# Patient Record
Sex: Female | Born: 1975 | Race: Black or African American | Hispanic: No | Marital: Single | Smoking: Former smoker
Health system: Southern US, Community
[De-identification: ages and names within clinical notes are randomized; demographics above are authoritative.]

## PROBLEM LIST (undated history)

## (undated) DIAGNOSIS — I10 Essential (primary) hypertension: Secondary | ICD-10-CM

## (undated) DIAGNOSIS — N189 Chronic kidney disease, unspecified: Secondary | ICD-10-CM

## (undated) HISTORY — PX: OTHER SURGICAL HISTORY: SHX169

## (undated) HISTORY — PX: COLONOSCOPY: SHX174

## (undated) HISTORY — PX: TONSILLECTOMY: SUR1361

---

## 2004-09-03 ENCOUNTER — Emergency Department: Payer: Self-pay | Admitting: Emergency Medicine

## 2004-11-29 ENCOUNTER — Ambulatory Visit: Payer: Self-pay | Admitting: Family Medicine

## 2004-12-13 ENCOUNTER — Emergency Department: Payer: Self-pay | Admitting: General Practice

## 2004-12-15 ENCOUNTER — Emergency Department: Payer: Self-pay | Admitting: Emergency Medicine

## 2005-02-10 ENCOUNTER — Ambulatory Visit: Payer: Self-pay | Admitting: Unknown Physician Specialty

## 2005-05-28 ENCOUNTER — Emergency Department: Payer: Self-pay | Admitting: Emergency Medicine

## 2005-11-11 ENCOUNTER — Emergency Department: Payer: Self-pay | Admitting: Emergency Medicine

## 2005-11-12 ENCOUNTER — Emergency Department: Payer: Self-pay | Admitting: Emergency Medicine

## 2006-01-28 ENCOUNTER — Emergency Department: Payer: Self-pay | Admitting: General Practice

## 2006-09-08 ENCOUNTER — Emergency Department: Payer: Self-pay | Admitting: Internal Medicine

## 2006-12-30 ENCOUNTER — Emergency Department: Payer: Self-pay | Admitting: Emergency Medicine

## 2007-06-16 ENCOUNTER — Emergency Department: Payer: Self-pay | Admitting: Unknown Physician Specialty

## 2008-01-02 ENCOUNTER — Emergency Department: Payer: Self-pay | Admitting: Emergency Medicine

## 2008-06-15 ENCOUNTER — Emergency Department: Payer: Self-pay | Admitting: Emergency Medicine

## 2008-06-18 ENCOUNTER — Emergency Department: Payer: Self-pay | Admitting: Emergency Medicine

## 2008-09-30 ENCOUNTER — Emergency Department: Payer: Self-pay | Admitting: Emergency Medicine

## 2009-01-11 ENCOUNTER — Emergency Department: Payer: Self-pay | Admitting: Emergency Medicine

## 2009-09-17 ENCOUNTER — Emergency Department: Payer: Self-pay | Admitting: Internal Medicine

## 2012-10-27 ENCOUNTER — Emergency Department: Payer: Self-pay | Admitting: Emergency Medicine

## 2013-07-06 ENCOUNTER — Emergency Department: Payer: Self-pay | Admitting: Emergency Medicine

## 2013-07-06 LAB — BASIC METABOLIC PANEL
Anion Gap: 4 — ABNORMAL LOW (ref 7–16)
BUN: 14 mg/dL (ref 7–18)
Calcium, Total: 8.7 mg/dL (ref 8.5–10.1)
Co2: 26 mmol/L (ref 21–32)
Creatinine: 1.21 mg/dL (ref 0.60–1.30)
EGFR (African American): >60
EGFR (Non-African Amer.): 58 — ABNORMAL LOW
Glucose: 84 mg/dL (ref 65–99)
Potassium: 4.2 mmol/L (ref 3.5–5.1)
Sodium: 140 mmol/L (ref 136–145)

## 2013-07-06 LAB — CBC
HCT: 38.7 % (ref 35.0–47.0)
HGB: 13.4 g/dL (ref 12.0–16.0)
MCHC: 34.7 g/dL (ref 32.0–36.0)
MCV: 93 fL (ref 80–100)
RDW: 13.5 % (ref 11.5–14.5)

## 2013-07-06 LAB — TROPONIN I: Troponin-I: <0.02 ng/mL

## 2015-02-14 ENCOUNTER — Emergency Department
Admit: 2015-02-14 | Disposition: A | Discharge status: Home / Self Care | Payer: Self-pay | Admitting: Physician Assistant

## 2015-02-14 LAB — COMPREHENSIVE METABOLIC PANEL
ALT: 30 U/L
Albumin: 3.7 g/dL
Alkaline Phosphatase: 47 U/L
Anion Gap: 12 (ref 7–16)
BILIRUBIN TOTAL: 0.5 mg/dL
BUN: 26 mg/dL — ABNORMAL HIGH
CHLORIDE: 102 mmol/L
CO2: 24 mmol/L
Calcium, Total: 9.9 mg/dL
Creatinine: 3.01 mg/dL — ABNORMAL HIGH
GFR CALC AF AMER: 22 — AB
GFR CALC NON AF AMER: 19 — AB
Glucose: 118 mg/dL — ABNORMAL HIGH
POTASSIUM: 3.7 mmol/L
SGOT(AST): 33 U/L
Sodium: 138 mmol/L
Total Protein: 7.6 g/dL

## 2015-02-14 LAB — CBC WITH DIFFERENTIAL/PLATELET
BASOS ABS: 0 10*3/uL (ref 0.0–0.1)
Basophil %: 0.4 %
EOS ABS: 0.1 10*3/uL (ref 0.0–0.7)
Eosinophil %: 1.7 %
HCT: 39.7 % (ref 35.0–47.0)
HGB: 13.4 g/dL (ref 12.0–16.0)
LYMPHS ABS: 1.7 10*3/uL (ref 1.0–3.6)
Lymphocyte %: 24.1 %
MCH: 31.2 pg (ref 26.0–34.0)
MCHC: 33.7 g/dL (ref 32.0–36.0)
MCV: 93 fL (ref 80–100)
MONOS PCT: 11.9 %
Monocyte #: 0.9 x10 3/mm (ref 0.2–0.9)
Neutrophil #: 4.5 10*3/uL (ref 1.4–6.5)
Neutrophil %: 61.9 %
PLATELETS: 255 10*3/uL (ref 150–440)
RBC: 4.28 10*6/uL (ref 3.80–5.20)
RDW: 12.8 % (ref 11.5–14.5)
WBC: 7.3 10*3/uL (ref 3.6–11.0)

## 2015-02-14 LAB — URINALYSIS, COMPLETE
Bilirubin,UR: NEGATIVE
GLUCOSE, UR: NEGATIVE mg/dL (ref 0–75)
Ketone: NEGATIVE
LEUKOCYTE ESTERASE: NEGATIVE
NITRITE: NEGATIVE
Ph: 5 (ref 4.5–8.0)
Protein: >=500
RBC,UR: 1 /HPF (ref 0–5)
SPECIFIC GRAVITY: 1.022 (ref 1.003–1.030)
Squamous Epithelial: 6
WBC UR: 11 /HPF (ref 0–5)

## 2015-02-15 ENCOUNTER — Emergency Department
Admit: 2015-02-15 | Disposition: A | Discharge status: Home / Self Care | Payer: Self-pay | Admitting: Emergency Medicine

## 2015-02-15 LAB — CREATININE, SERUM
Creatinine: 2.6 mg/dL — ABNORMAL HIGH
GFR CALC AF AMER: 26 — AB
GFR CALC NON AF AMER: 23 — AB

## 2015-11-06 ENCOUNTER — Emergency Department
Admission: EM | Admit: 2015-11-06 | Discharge: 2015-11-06 | Disposition: A | Discharge status: Home / Self Care | Payer: Medicaid Other | Attending: Emergency Medicine | Admitting: Emergency Medicine

## 2015-11-06 ENCOUNTER — Encounter: Payer: Self-pay | Admitting: Emergency Medicine

## 2015-11-06 ENCOUNTER — Emergency Department: Payer: Medicaid Other

## 2015-11-06 DIAGNOSIS — R55 Syncope and collapse: Secondary | ICD-10-CM | POA: Insufficient documentation

## 2015-11-06 DIAGNOSIS — I129 Hypertensive chronic kidney disease with stage 1 through stage 4 chronic kidney disease, or unspecified chronic kidney disease: Secondary | ICD-10-CM | POA: Diagnosis not present

## 2015-11-06 DIAGNOSIS — N189 Chronic kidney disease, unspecified: Secondary | ICD-10-CM | POA: Diagnosis not present

## 2015-11-06 DIAGNOSIS — M542 Cervicalgia: Secondary | ICD-10-CM | POA: Diagnosis not present

## 2015-11-06 DIAGNOSIS — Z3202 Encounter for pregnancy test, result negative: Secondary | ICD-10-CM | POA: Insufficient documentation

## 2015-11-06 DIAGNOSIS — Z87891 Personal history of nicotine dependence: Secondary | ICD-10-CM | POA: Insufficient documentation

## 2015-11-06 DIAGNOSIS — R51 Headache: Secondary | ICD-10-CM | POA: Insufficient documentation

## 2015-11-06 HISTORY — DX: Essential (primary) hypertension: I10

## 2015-11-06 LAB — BASIC METABOLIC PANEL
ANION GAP: 5 (ref 5–15)
BUN: 29 mg/dL — ABNORMAL HIGH (ref 6–20)
CALCIUM: 9.2 mg/dL (ref 8.9–10.3)
CO2: 23 mmol/L (ref 22–32)
Chloride: 108 mmol/L (ref 101–111)
Creatinine, Ser: 3.26 mg/dL — ABNORMAL HIGH (ref 0.44–1.00)
GFR, EST AFRICAN AMERICAN: 20 mL/min — AB (ref >60)
GFR, EST NON AFRICAN AMERICAN: 17 mL/min — AB (ref >60)
Glucose, Bld: 106 mg/dL — ABNORMAL HIGH (ref 65–99)
Potassium: 4.1 mmol/L (ref 3.5–5.1)
SODIUM: 136 mmol/L (ref 135–145)

## 2015-11-06 LAB — URINALYSIS COMPLETE WITH MICROSCOPIC (ARMC ONLY)
BILIRUBIN URINE: NEGATIVE
GLUCOSE, UA: NEGATIVE mg/dL
HGB URINE DIPSTICK: NEGATIVE
KETONES UR: NEGATIVE mg/dL
LEUKOCYTES UA: NEGATIVE
NITRITE: NEGATIVE
PH: 5 (ref 5.0–8.0)
Protein, ur: >500 mg/dL — AB
Specific Gravity, Urine: 1.005 (ref 1.005–1.030)

## 2015-11-06 LAB — CBC
HCT: 36.4 % (ref 35.0–47.0)
HEMOGLOBIN: 12.5 g/dL (ref 12.0–16.0)
MCH: 31.5 pg (ref 26.0–34.0)
MCHC: 34.2 g/dL (ref 32.0–36.0)
MCV: 92.2 fL (ref 80.0–100.0)
PLATELETS: 212 10*3/uL (ref 150–440)
RBC: 3.95 MIL/uL (ref 3.80–5.20)
RDW: 13.6 % (ref 11.5–14.5)
WBC: 6.9 10*3/uL (ref 3.6–11.0)

## 2015-11-06 LAB — POCT PREGNANCY, URINE: Preg Test, Ur: NEGATIVE

## 2015-11-06 MED ORDER — SODIUM CHLORIDE 0.9 % IV SOLN
Freq: Once | INTRAVENOUS | Status: AC
  Administered 2015-11-06: 20:00:00 via INTRAVENOUS

## 2015-11-06 NOTE — ED Notes (Signed)
While mopping floor with clorox, started to feel lightheaded, walked out of room to take a break and while walking back to bedroom, passed out.  Occurred approximately 45 min PTA.  C/O neck and back pain.    AAOx3.  Moving all extremities equally and strong.  Gait steady.  Posture upright.

## 2015-11-06 NOTE — ED Notes (Signed)
Pt presents to ED with c/o "I passed out at home." Pt reports that she uses bleach to clean and it often makes her feel lightheaded. Pt reports that she woke up after passing out with her daughter standing over her. Pt reports pain to left side of neck with movement. Pt denies chest pain/shortness of breath. Pt is awake and alert, speaking in complete, coherent sentences without difficulty at this time. No acute distress noted. Will continue to monitor.

## 2015-11-06 NOTE — ED Notes (Signed)
Pt resting quietly on stretcher with NS infusing at this time. Requested urine sample from pt... Pt reports unable to urinate at this time. Pt instructed to notify staff when able to urinate. Call bell within reach. No acute distress noted at this time. Will continue to monitor.

## 2015-11-06 NOTE — ED Provider Notes (Signed)
[facility] Emergency Department Provider Note     Time seen: ----------------------------------------- 7:36 PM on 11/06/2015 -----------------------------------------    I have reviewed the triage vital signs and the nursing notes.   HISTORY  Chief Complaint Loss of Consciousness    HPI Maria Chen is a 39 y.o. female who presents to ER after a syncopal event. Patient was mopping the floor with Clorox, she walked other undetected breakable walking back to the bathroom she passed out. This occurred approximately 45 minutes prior to arrival. She does complain of a headache with some mild neck and back pain. She denies any recent illness or changes in her medicines.   Past Medical History  Diagnosis Date  . Hypertension     There are no active problems to display for this patient.   History reviewed. No pertinent past surgical history.  Allergies Altace  Social History Social History  Substance Use Topics  . Smoking status: Former Research scientist (life sciences)  . Smokeless tobacco: Never Used  . Alcohol Use: No    Review of Systems Constitutional: Negative for fever. Eyes: Negative for visual changes. ENT: Negative for sore throat. Cardiovascular: Negative for chest pain. Respiratory: Negative for shortness of breath. Gastrointestinal: Negative for abdominal pain, vomiting and diarrhea. Genitourinary: Negative for dysuria. Musculoskeletal: Negative for back pain. Skin: Negative for rash. Neurological: Positive for headache  10-point ROS otherwise negative.  ____________________________________________   PHYSICAL EXAM:  VITAL SIGNS: ED Triage Vitals  Enc Vitals Group     BP 11/06/15 1819 141/89 mmHg     Pulse Rate 11/06/15 1819 67     Resp 11/06/15 1819 16     Temp 11/06/15 1819 97.8 F (36.6 C)     Temp Source 11/06/15 1819 Oral     SpO2 11/06/15 1819 93 %     Weight 11/06/15 1819 235 lb (106.595 kg)     Height 11/06/15 1819 5\' 5"   (1.651 m)     Head Cir --      Peak Flow --      Pain Score 11/06/15 1821 8     Pain Loc --      Pain Edu? --      Excl. in [place]? --     Constitutional: Alert and oriented. Well appearing and in no distress. Eyes: Conjunctivae are normal. PERRL. Normal extraocular movements. ENT   Head: Midline parietal scalp tenderness   Nose: No congestion/rhinnorhea.   Mouth/Throat: Mucous membranes are moist.   Neck: No stridor. Cardiovascular: Normal rate, regular rhythm. Normal and symmetric distal pulses are present in all extremities. No murmurs, rubs, or gallops. Respiratory: Normal respiratory effort without tachypnea nor retractions. Breath sounds are clear and equal bilaterally. No wheezes/rales/rhonchi. Gastrointestinal: Soft and nontender. No distention. No abdominal bruits.  Musculoskeletal: Nontender with normal range of motion in all extremities. No joint effusions.  No lower extremity tenderness nor edema. Neurologic:  Normal speech and language. No gross focal neurologic deficits are appreciated. Speech is normal. No gait instability. Skin:  Skin is warm, dry and intact. No rash noted. Psychiatric: Mood and affect are normal. Speech and behavior are normal. Patient exhibits appropriate insight and judgment. ____________________________________________  EKG: Interpreted by me. Normal sinus rhythm with a rate of 60 bpm, normal PR interval, normal QRS with, normal QT interval.  ____________________________________________  ED COURSE:  Pertinent labs & imaging results that were available during my care of the patient were reviewed by me and considered in my medical decision making (see chart for  details). Patient is in no acute distress, will check basic labs and CT imaging. ____________________________________________    LABS (pertinent positives/negatives)  Labs Reviewed  BASIC METABOLIC PANEL - Abnormal; Notable for the following:    Glucose, Bld 106 (*)    BUN 29  (*)    Creatinine, Ser 3.26 (*)    GFR calc non Af Amer 17 (*)    GFR calc Af Amer 20 (*)    All other components within normal limits  URINALYSIS COMPLETEWITH MICROSCOPIC ([facility] ONLY) - Abnormal; Notable for the following:    Color, Urine STRAW (*)    APPearance CLEAR (*)    Protein, ur >500 (*)    Bacteria, UA RARE (*)    Squamous Epithelial / LPF 0-5 (*)    All other components within normal limits  CBC  POC URINE PREG, ED  POCT PREGNANCY, URINE    RADIOLOGY Images were viewed by me  CT head IMPRESSION: 1. Negative for bleed or other acute intracranial process. 2. 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 Recommend elective outpatient MR brain with contrast for further characterization. Findings reviewed with Dr. Jobe Igo, who concurs. ____________________________________________  FINAL ASSESSMENT AND PLAN  Chronic renal insufficiency, syncope  Plan: Patient with labs and imaging as dictated above. Patient is aware of her renal insufficiency and sees a nephrologist. I have given her a liter of saline to try to improve renal function. She is making urine here and her urine here is grossly unremarkable. Syncope is likely related to chemical she was using to clean with. She stable for discharge.   Earleen Newport, MD   Earleen Newport, MD 11/06/15 2207

## 2015-11-06 NOTE — Discharge Instructions (Signed)
Chronic Kidney Disease Chronic kidney disease occurs when the kidneys are damaged over a long period. The kidneys are two organs that lie on either side of the spine between the middle of the back and the front of the abdomen. The kidneys:  Remove wastes and extra water from the blood.  Produce important hormones. These help keep bones strong, regulate blood pressure, and help create red blood cells.  Balance the fluids and chemicals in the blood and tissues. A small amount of kidney damage may not cause problems, but a large amount of damage may make it difficult or impossible for the kidneys to work the way they should. If steps are not taken to slow down the kidney damage or stop it from getting worse, the kidneys may stop working permanently. Most of the time, chronic kidney disease does not go away. However, it can often be controlled, and those with the disease can usually live normal lives. CAUSES The most common causes of chronic kidney disease are diabetes and high blood pressure (hypertension). Chronic kidney disease may also be caused by:  Diseases that cause the kidneys' filters to become inflamed.  Diseases that affect the immune system.  Genetic diseases.  Medicines that damage the kidneys, such as anti-inflammatory medicines.  Poisoning or exposure to toxic substances.  A reoccurring kidney or urinary infection.  A problem with urine flow. This may be caused by:  Cancer.  Kidney stones.  An enlarged prostate in males. SIGNS AND SYMPTOMS Because the kidney damage in chronic kidney disease occurs slowly, symptoms develop slowly and may not be obvious until the kidney damage becomes severe. A person may have a kidney disease for years without showing any symptoms. Symptoms can include:  Swelling (edema) of the legs, ankles, or feet.  Tiredness (lethargy).  Nausea or vomiting.  Confusion.  Problems with urination, such as:  Decreased urine  production.  Frequent urination, especially at night.  Frequent accidents in children who are potty trained.  Muscle twitches and cramps.  Shortness of breath.  Weakness.  Persistent itchiness.  Loss of appetite.  Metallic taste in the mouth.  Trouble sleeping.  Slowed development in children.  Short stature in children. DIAGNOSIS Chronic kidney disease may be detected and diagnosed by tests, including blood, urine, imaging, or kidney biopsy tests. TREATMENT Most chronic kidney diseases cannot be cured. Treatment usually involves relieving symptoms and preventing or slowing the progression of the disease. Treatment may include:  A special diet. You may need to avoid alcohol and foods thatare salty and high in potassium.  Medicines. These may:  Lower blood pressure.  Relieve anemia.  Relieve swelling.  Protect the bones. HOME CARE INSTRUCTIONS  Follow your prescribed diet. Your health care provider may instruct you to limit daily salt (sodium) and protein intake.  Take medicines only as directed by your health care provider. Do not take any new medicines (prescription, over-the-counter, or nutritional supplements) unless approved by your health care provider. Many medicines can worsen your kidney damage or need to have the dose adjusted.   Quit smoking if you smoke. Talk to your health care provider about a smoking cessation program.  Keep all follow-up visits as directed by your health care provider.  Monitor your blood pressure.  Start or continue an exercise plan.  Get immunizations as directed by your health care provider.  Take vitamin and mineral supplements as directed by your health care provider. SEEK IMMEDIATE MEDICAL CARE IF:  Your symptoms get worse or you develop  new symptoms.  You develop symptoms of end-stage kidney disease. These include:  Headaches.  Abnormally dark or light skin.  Numbness in the hands or feet.  Easy  bruising.  Frequent hiccups.  Menstruation stops.  You have a fever.  You have decreased urine production.  You havepain or bleeding when urinating. MAKE SURE YOU:  Understand these instructions.  Will watch your condition.  Will get help right away if you are not doing well or get worse. FOR MORE INFORMATION   American Association of Kidney Patients: BombTimer.gl  National Kidney Foundation: www.kidney.[place]: https://mathis.com/  [facility]: www.lifeoptions.org and www.kidneyschool.org   This information is not intended to replace advice given to you by your health care provider. Make sure you discuss any questions you have with your health care provider.   Document Released: 08/08/2008 Document Revised: 11/20/2014 Document Reviewed: 06/28/2012 Elsevier Interactive Patient Education 2016 [employer].  Syncope Syncope is a medical term for fainting or passing out. This means you lose consciousness and drop to the ground. People are generally unconscious for less than 5 minutes. You may have some muscle twitches for up to 15 seconds before waking up and returning to normal. Syncope occurs more often in older adults, but it can happen to anyone. While most causes of syncope are not dangerous, syncope can be a sign of a serious medical problem. It is important to seek medical care.  CAUSES  Syncope is caused by a sudden drop in blood flow to the brain. The specific cause is often not determined. Factors that can bring on syncope include:  Taking medicines that lower blood pressure.  Sudden changes in posture, such as standing up quickly.  Taking more medicine than prescribed.  Standing in one place for too long.  Seizure disorders.  Dehydration and excessive exposure to heat.  Low blood sugar (hypoglycemia).  Straining to have a bowel movement.  Heart disease, irregular heartbeat, or other circulatory problems.  Fear,  emotional distress, seeing blood, or severe pain. SYMPTOMS  Right before fainting, you may:  Feel dizzy or light-headed.  Feel nauseous.  See all white or all black in your field of vision.  Have cold, clammy skin. DIAGNOSIS  Your health care provider will ask about your symptoms, perform a physical exam, and perform an electrocardiogram (ECG) to record the electrical activity of your heart. Your health care provider may also perform other heart or blood tests to determine the cause of your syncope which may include:  Transthoracic echocardiogram (TTE). During echocardiography, sound waves are used to evaluate how blood flows through your heart.  Transesophageal echocardiogram (TEE).  Cardiac monitoring. This allows your health care provider to monitor your heart rate and rhythm in real time.  Holter monitor. This is a portable device that records your heartbeat and can help diagnose heart arrhythmias. It allows your health care provider to track your heart activity for several days, if needed.  Stress tests by exercise or by giving medicine that makes the heart beat faster. TREATMENT  In most cases, no treatment is needed. Depending on the cause of your syncope, your health care provider may recommend changing or stopping some of your medicines. HOME CARE INSTRUCTIONS  Have someone stay with you until you feel stable.  Do not drive, use machinery, or play sports until your health care provider says it is okay.  Keep all follow-up appointments as directed by your health care provider.  Lie down right away if you  start feeling like you might faint. Breathe deeply and steadily. Wait until all the symptoms have passed.  Drink enough fluids to keep your urine clear or pale yellow.  If you are taking blood pressure or heart medicine, get up slowly and take several minutes to sit and then stand. This can reduce dizziness. SEEK IMMEDIATE MEDICAL CARE IF:   You have a severe  headache.  You have unusual pain in the chest, abdomen, or back.  You are bleeding from your mouth or rectum, or you have black or tarry stool.  You have an irregular or very fast heartbeat.  You have pain with breathing.  You have repeated fainting or seizure-like jerking during an episode.  You faint when sitting or lying down.  You have confusion.  You have trouble walking.  You have severe weakness.  You have vision problems. If you fainted, call your local emergency services (911 in [place] Do not drive yourself to the hospital.    This information is not intended to replace advice given to you by your health care provider. Make sure you discuss any questions you have with your health care provider.   Document Released: 10/30/2005 Document Revised: 03/16/2015 Document Reviewed: 12/29/2011 Elsevier Interactive Patient Education [employer].

## 2016-02-02 ENCOUNTER — Other Ambulatory Visit: Payer: Self-pay | Admitting: Physician Assistant

## 2016-02-02 DIAGNOSIS — R1031 Right lower quadrant pain: Secondary | ICD-10-CM

## 2016-02-08 ENCOUNTER — Other Ambulatory Visit: Payer: Self-pay | Admitting: Physician Assistant

## 2016-02-08 DIAGNOSIS — R1031 Right lower quadrant pain: Secondary | ICD-10-CM

## 2016-02-09 ENCOUNTER — Ambulatory Visit: Admission: RE | Admit: 2016-02-09 | Payer: Medicaid Other | Source: Ambulatory Visit

## 2016-02-11 ENCOUNTER — Ambulatory Visit: Admission: RE | Admit: 2016-02-11 | Payer: Medicaid Other | Source: Ambulatory Visit

## 2016-02-14 ENCOUNTER — Ambulatory Visit: Payer: Medicaid Other

## 2016-03-02 ENCOUNTER — Other Ambulatory Visit: Payer: Self-pay | Admitting: Physician Assistant

## 2016-03-02 DIAGNOSIS — R1031 Right lower quadrant pain: Secondary | ICD-10-CM

## 2016-03-07 ENCOUNTER — Ambulatory Visit
Admission: RE | Admit: 2016-03-07 | Discharge: 2016-03-07 | Disposition: A | Discharge status: Home / Self Care | Payer: Medicaid Other | Source: Ambulatory Visit | Attending: Physician Assistant | Admitting: Physician Assistant

## 2016-03-07 DIAGNOSIS — R1031 Right lower quadrant pain: Secondary | ICD-10-CM

## 2016-03-07 DIAGNOSIS — D259 Leiomyoma of uterus, unspecified: Secondary | ICD-10-CM | POA: Diagnosis not present

## 2016-06-15 ENCOUNTER — Other Ambulatory Visit: Payer: Self-pay | Admitting: Vascular Surgery

## 2016-06-19 ENCOUNTER — Ambulatory Visit
Admission: RE | Admit: 2016-06-19 | Discharge: 2016-06-19 | Disposition: A | Discharge status: Home / Self Care | Payer: Medicaid Other | Source: Ambulatory Visit | Attending: Vascular Surgery | Admitting: Vascular Surgery

## 2016-06-19 DIAGNOSIS — Z01812 Encounter for preprocedural laboratory examination: Secondary | ICD-10-CM | POA: Diagnosis not present

## 2016-06-19 LAB — BASIC METABOLIC PANEL
Anion gap: 7 (ref 5–15)
BUN: 67 mg/dL — AB (ref 6–20)
CALCIUM: 9.7 mg/dL (ref 8.9–10.3)
CHLORIDE: 108 mmol/L (ref 101–111)
CO2: 21 mmol/L — ABNORMAL LOW (ref 22–32)
CREATININE: 7.74 mg/dL — AB (ref 0.44–1.00)
GFR calc Af Amer: 7 mL/min — ABNORMAL LOW (ref >60)
GFR, EST NON AFRICAN AMERICAN: 6 mL/min — AB (ref >60)
Glucose, Bld: 91 mg/dL (ref 65–99)
Potassium: 4.5 mmol/L (ref 3.5–5.1)
SODIUM: 136 mmol/L (ref 135–145)

## 2016-06-19 LAB — CBC WITH DIFFERENTIAL/PLATELET
BASOS PCT: 1 %
Basophils Absolute: 0 10*3/uL (ref 0–0.1)
EOS ABS: 0.2 10*3/uL (ref 0–0.7)
EOS PCT: 3 %
HCT: 31.5 % — ABNORMAL LOW (ref 35.0–47.0)
Hemoglobin: 11 g/dL — ABNORMAL LOW (ref 12.0–16.0)
LYMPHS ABS: 1.4 10*3/uL (ref 1.0–3.6)
Lymphocytes Relative: 21 %
MCH: 31.1 pg (ref 26.0–34.0)
MCHC: 35 g/dL (ref 32.0–36.0)
MCV: 88.6 fL (ref 80.0–100.0)
Monocytes Absolute: 0.5 10*3/uL (ref 0.2–0.9)
Monocytes Relative: 7 %
Neutro Abs: 4.6 10*3/uL (ref 1.4–6.5)
Neutrophils Relative %: 68 %
PLATELETS: 218 10*3/uL (ref 150–440)
RBC: 3.56 MIL/uL — AB (ref 3.80–5.20)
RDW: 14.6 % — ABNORMAL HIGH (ref 11.5–14.5)
WBC: 6.6 10*3/uL (ref 3.6–11.0)

## 2016-06-19 LAB — PROTIME-INR
INR: 1
Prothrombin Time: 13.2 seconds (ref 11.4–15.2)

## 2016-06-19 LAB — APTT: APTT: 29 s (ref 24–36)

## 2016-06-19 LAB — SURGICAL PCR SCREEN
MRSA, PCR: NEGATIVE
Staphylococcus aureus: NEGATIVE

## 2016-06-19 NOTE — Patient Instructions (Signed)
  Your procedure is scheduled [id number] August 18 , 2017. Report to Same Day Surgery. To find out your arrival time please call [phone] between 1PM - 3PM on Thursday June 29, 2016.  Remember: Instructions that are not followed completely may result in serious medical risk, up to and including death, or upon the discretion of your surgeon and anesthesiologist your surgery may need to be rescheduled.    _x___ 1. Do not eat food or drink liquids after midnight. No gum chewing or  hard candies.     ____ 2. No Alcohol for 24 hours before or after surgery.   ____ 3. Bring all medications with you on the day of surgery if instructed.    __x__ 4. Notify your doctor if there is any change in your medical condition     (cold, fever, infections).     Do not wear jewelry, make-up, hairpins, clips or nail polish.  Do not wear lotions, powders, or perfumes.   Do not shave 48 hours prior to surgery. Men may shave face and neck.  Do not bring valuables to the hospital.    [facility] is not responsible for any belongings or valuables.               Contacts, dentures or bridgework may not be worn into surgery.  Leave your suitcase in the car. After surgery it may be brought to your room.  For patients admitted to the hospital, discharge time is determined by your treatment team.   Patients discharged the day of surgery will not be allowed to drive home.    Please read over the following fact sheets that you were given:   [facility] Preparing for Surgery  _x_ Take these medicines the morning of surgery with A SIP OF WATER:    1. amLODipine (NORVASC)  2. metoprolol succinate (TOPROL-XL) 25 MG 24 hr tablet   ____ Fleet Enema (as directed)   _x___ Use CHG Soap as directed on instruction sheet  ____ Use inhalers on the day of surgery and bring to hospital day of surgery  ____ Stop metformin 2 days prior to surgery    ____ Take 1/2 of usual insulin dose the night before surgery and  none on the morning of          surgery.   ____ Stop Coumadin/Plavix/aspirin on does not apply.  _x___ Stop Anti-inflammatories such as Advil, Aleve, Ibuprofen, Motrin, Naproxen, Naprosyn, Goodies powders or aspirin products. OK to take Tylenol.   __x__ Stop supplements: [facility] D3: until after surgery.    ____ Bring C-Pap to the hospital.

## 2016-06-19 NOTE — Pre-Procedure Instructions (Signed)
Spoke with Dr. Nino Parsley nurse Mickel Baas regarding T&S order in light of pt being a Jehov[employer] Witness and refusing blood products, dc T+S.

## 2016-06-19 NOTE — Pre-Procedure Instructions (Signed)
H+P done 05/22/16 will be out of date on 06/30/16, notified Mickel Baas at Dr. Nino Parsley office.  Mickel Baas will try and contact pt for an office visit to redo H+P.

## 2016-06-20 NOTE — Pre-Procedure Instructions (Signed)
BUN, Creat, and EGFR results faxed and called to Dr. Delana Meyer office (spoke to [employer]).

## 2016-06-30 ENCOUNTER — Encounter
Admission: RE | Disposition: A | Discharge status: Home / Self Care | Payer: Self-pay | Source: Ambulatory Visit | Attending: Vascular Surgery

## 2016-06-30 ENCOUNTER — Ambulatory Visit: Payer: Medicaid Other | Admitting: Anesthesiology

## 2016-06-30 ENCOUNTER — Ambulatory Visit
Admission: RE | Admit: 2016-06-30 | Discharge: 2016-06-30 | Disposition: A | Discharge status: Home / Self Care | Payer: Medicaid Other | Source: Ambulatory Visit | Attending: Vascular Surgery | Admitting: Vascular Surgery

## 2016-06-30 ENCOUNTER — Encounter: Payer: Self-pay | Admitting: Anesthesiology

## 2016-06-30 DIAGNOSIS — Z79899 Other long term (current) drug therapy: Secondary | ICD-10-CM | POA: Diagnosis not present

## 2016-06-30 DIAGNOSIS — Z888 Allergy status to other drugs, medicaments and biological substances status: Secondary | ICD-10-CM | POA: Insufficient documentation

## 2016-06-30 DIAGNOSIS — I120 Hypertensive chronic kidney disease with stage 5 chronic kidney disease or end stage renal disease: Secondary | ICD-10-CM | POA: Diagnosis present

## 2016-06-30 DIAGNOSIS — N185 Chronic kidney disease, stage 5: Secondary | ICD-10-CM | POA: Diagnosis not present

## 2016-06-30 DIAGNOSIS — E1122 Type 2 diabetes mellitus with diabetic chronic kidney disease: Secondary | ICD-10-CM | POA: Diagnosis not present

## 2016-06-30 HISTORY — DX: Chronic kidney disease, unspecified: N18.9

## 2016-06-30 HISTORY — PX: BASCILIC VEIN TRANSPOSITION: SHX5742

## 2016-06-30 LAB — POCT PREGNANCY, URINE: Preg Test, Ur: NEGATIVE

## 2016-06-30 SURGERY — TRANSPOSITION, VEIN, BASILIC
Anesthesia: General | Laterality: Left

## 2016-06-30 MED ORDER — GLYCOPYRROLATE 0.2 MG/ML IJ SOLN
INTRAMUSCULAR | Status: DC | PRN
  Administered 2016-06-30: 0.2 mg via INTRAVENOUS

## 2016-06-30 MED ORDER — OXYCODONE-ACETAMINOPHEN 5-325 MG PO TABS: 1.0000 | ORAL_TABLET | Freq: Four times a day (QID) | ORAL | 0 refills | Status: AC | PRN

## 2016-06-30 MED ORDER — ONDANSETRON HCL 4 MG/2ML IJ SOLN: 4.0000 mg | Freq: Once | INTRAMUSCULAR | Status: DC | PRN

## 2016-06-30 MED ORDER — CEFAZOLIN SODIUM-DEXTROSE 2-4 GM/100ML-% IV SOLN
2.0000 g | INTRAVENOUS | Status: AC
  Administered 2016-06-30 (×2): 2 g via INTRAVENOUS

## 2016-06-30 MED ORDER — HEPARIN SODIUM (PORCINE) 5000 UNIT/ML IJ SOLN
INTRAMUSCULAR | Status: AC
  Filled 2016-06-30: qty 1

## 2016-06-30 MED ORDER — FENTANYL CITRATE (PF) 100 MCG/2ML IJ SOLN
INTRAMUSCULAR | Status: DC | PRN
  Administered 2016-06-30: 50 ug via INTRAVENOUS
  Administered 2016-06-30 (×2): 25 ug via INTRAVENOUS
  Administered 2016-06-30 (×2): 50 ug via INTRAVENOUS

## 2016-06-30 MED ORDER — FENTANYL CITRATE (PF) 100 MCG/2ML IJ SOLN
INTRAMUSCULAR | Status: AC
  Administered 2016-06-30: 25 ug via INTRAVENOUS
  Filled 2016-06-30: qty 2

## 2016-06-30 MED ORDER — PAPAVERINE HCL 30 MG/ML IJ SOLN
INTRAMUSCULAR | Status: AC
  Filled 2016-06-30: qty 2

## 2016-06-30 MED ORDER — BUPIVACAINE HCL (PF) 0.5 % IJ SOLN
INTRAMUSCULAR | Status: AC
  Filled 2016-06-30: qty 30

## 2016-06-30 MED ORDER — SODIUM CHLORIDE 0.9 % IV SOLN
INTRAVENOUS | Status: DC
Start: 2016-06-30 — End: 2016-06-30
  Administered 2016-06-30 (×2): via INTRAVENOUS

## 2016-06-30 MED ORDER — PROPOFOL 10 MG/ML IV BOLUS
INTRAVENOUS | Status: DC | PRN
  Administered 2016-06-30: 30 mg via INTRAVENOUS
  Administered 2016-06-30: 130 mg via INTRAVENOUS

## 2016-06-30 MED ORDER — FENTANYL CITRATE (PF) 100 MCG/2ML IJ SOLN
25.0000 ug | INTRAMUSCULAR | Status: DC | PRN
  Administered 2016-06-30 (×5): 25 ug via INTRAVENOUS

## 2016-06-30 MED ORDER — FAMOTIDINE 20 MG PO TABS
20.0000 mg | ORAL_TABLET | Freq: Once | ORAL | Status: AC
  Administered 2016-06-30: 20 mg via ORAL

## 2016-06-30 MED ORDER — CEFAZOLIN SODIUM-DEXTROSE 2-4 GM/100ML-% IV SOLN
INTRAVENOUS | Status: AC
  Filled 2016-06-30: qty 100

## 2016-06-30 MED ORDER — SODIUM CHLORIDE 0.9 % IV SOLN
INTRAVENOUS | Status: DC | PRN
  Administered 2016-06-30: 11:00:00 via INTRAMUSCULAR

## 2016-06-30 MED ORDER — CHLORHEXIDINE GLUCONATE CLOTH 2 % EX PADS: 6.0000 | MEDICATED_PAD | Freq: Once | CUTANEOUS | Status: DC

## 2016-06-30 MED ORDER — FAMOTIDINE 20 MG PO TABS
ORAL_TABLET | ORAL | Status: AC
  Filled 2016-06-30: qty 1

## 2016-06-30 MED ORDER — LIDOCAINE HCL (CARDIAC) 20 MG/ML IV SOLN
INTRAVENOUS | Status: DC | PRN
  Administered 2016-06-30: 30 mg via INTRAVENOUS

## 2016-06-30 MED ORDER — OXYCODONE-ACETAMINOPHEN 5-325 MG PO TABS
ORAL_TABLET | ORAL | Status: AC
  Administered 2016-06-30: 1 via ORAL
  Filled 2016-06-30: qty 1

## 2016-06-30 MED ORDER — ONDANSETRON HCL 4 MG/2ML IJ SOLN
INTRAMUSCULAR | Status: DC | PRN
  Administered 2016-06-30: 4 mg via INTRAVENOUS

## 2016-06-30 MED ORDER — MIDAZOLAM HCL 2 MG/2ML IJ SOLN
INTRAMUSCULAR | Status: DC | PRN
  Administered 2016-06-30: 0.5 mg via INTRAVENOUS
  Administered 2016-06-30: 1 mg via INTRAVENOUS
  Administered 2016-06-30: 0.5 mg via INTRAVENOUS

## 2016-06-30 MED ORDER — DEXAMETHASONE SODIUM PHOSPHATE 10 MG/ML IJ SOLN
INTRAMUSCULAR | Status: DC | PRN
  Administered 2016-06-30: 5 mg via INTRAVENOUS

## 2016-06-30 SURGICAL SUPPLY — 55 items
APPLIER CLIP 11 MED OPEN (CLIP)
APPLIER CLIP 9.375 SM OPEN (CLIP)
BAG DECANTER FOR FLEXI CONT (MISCELLANEOUS) ×2 IMPLANT
BLADE SURG 15 STRL LF DISP TIS (BLADE) ×1 IMPLANT
BLADE SURG 15 STRL SS (BLADE) ×1
BLADE SURG SZ11 CARB STEEL (BLADE) ×2 IMPLANT
BOOT SUTURE AID YELLOW STND (SUTURE) ×2 IMPLANT
BRUSH SCRUB 4% CHG (MISCELLANEOUS) ×2 IMPLANT
CANISTER SUCT 1200ML W/VALVE (MISCELLANEOUS) ×2 IMPLANT
CHLORAPREP W/TINT 26ML (MISCELLANEOUS) ×2 IMPLANT
CLIP APPLIE 11 MED OPEN (CLIP) IMPLANT
CLIP APPLIE 9.375 SM OPEN (CLIP) IMPLANT
DECANTER SPIKE VIAL GLASS SM (MISCELLANEOUS) IMPLANT
DRESSING SURGICEL FIBRLLR 1X2 (HEMOSTASIS) ×1 IMPLANT
DRSG SURGICEL FIBRILLAR 1X2 (HEMOSTASIS) ×2
ELECT CAUTERY BLADE 6.4 (BLADE) ×2 IMPLANT
ELECT REM PT RETURN 9FT ADLT (ELECTROSURGICAL)
ELECTRODE REM PT RTRN 9FT ADLT (ELECTROSURGICAL) IMPLANT
GEL ULTRASOUND 20GR AQUASONIC (MISCELLANEOUS) IMPLANT
GLOVE BIO SURGEON STRL SZ7 (GLOVE) ×2 IMPLANT
GLOVE INDICATOR 7.5 STRL GRN (GLOVE) ×2 IMPLANT
GLOVE SURG SYN 8.0 (GLOVE) ×10 IMPLANT
GOWN L4 XLG 20 PK N/S (GOWN DISPOSABLE) ×2 IMPLANT
GOWN STRL REUS W/ TWL LRG LVL3 (GOWN DISPOSABLE) ×2 IMPLANT
GOWN STRL REUS W/TWL LRG LVL3 (GOWN DISPOSABLE) ×2
IV NS 500ML (IV SOLUTION) ×1
IV NS 500ML BAXH (IV SOLUTION) ×1 IMPLANT
KIT RM TURNOVER STRD PROC AR (KITS) ×2 IMPLANT
LABEL OR SOLS (LABEL) ×2 IMPLANT
LIQUID BAND (GAUZE/BANDAGES/DRESSINGS) ×2 IMPLANT
LOOP RED MAXI  1X406MM (MISCELLANEOUS) ×1
LOOP VESSEL MAXI 1X406 RED (MISCELLANEOUS) ×1 IMPLANT
LOOP VESSEL MINI 0.8X406 BLUE (MISCELLANEOUS) ×2 IMPLANT
LOOPS BLUE MINI 0.8X406MM (MISCELLANEOUS) ×2
NEEDLE FILTER BLUNT 18X 1/2SAF (NEEDLE) ×1
NEEDLE FILTER BLUNT 18X1 1/2 (NEEDLE) ×1 IMPLANT
PACK EXTREMITY ARMC (MISCELLANEOUS) ×2 IMPLANT
PAD PREP 24X41 OB/GYN DISP (PERSONAL CARE ITEMS) ×2 IMPLANT
SPONGE XRAY 4X4 16PLY STRL (MISCELLANEOUS) ×2 IMPLANT
STOCKINETTE 4 (MISCELLANEOUS) ×2 IMPLANT
STOCKINETTE STRL 4IN 9604848 (GAUZE/BANDAGES/DRESSINGS) ×2 IMPLANT
SUT MNCRL+ 5-0 UNDYED PC-3 (SUTURE) ×2 IMPLANT
SUT MONOCRYL 5-0 (SUTURE) ×2
SUT PROLENE 6 0 BV (SUTURE) ×8 IMPLANT
SUT SILK 2 0 (SUTURE) ×1
SUT SILK 2 0 SH (SUTURE) ×2 IMPLANT
SUT SILK 2-0 18XBRD TIE 12 (SUTURE) ×1 IMPLANT
SUT SILK 3 0 (SUTURE) ×2
SUT SILK 3-0 18XBRD TIE 12 (SUTURE) ×2 IMPLANT
SUT SILK 4 0 (SUTURE) ×1
SUT SILK 4-0 18XBRD TIE 12 (SUTURE) ×1 IMPLANT
SUT VIC AB 3-0 SH 27 (SUTURE) ×3
SUT VIC AB 3-0 SH 27X BRD (SUTURE) ×3 IMPLANT
SYR 20CC LL (SYRINGE) ×4 IMPLANT
SYR 3ML LL SCALE MARK (SYRINGE) ×2 IMPLANT

## 2016-06-30 NOTE — H&P (Signed)
[facility] Admission History & Physical  MRN : [id number]  Maria Chen is a 40 y.o. (11/06/1976) female who presents with chief complaint of need dialysis access.  History of Present Illness: Patient has been evaluated in the office she is progressing and her renal failure. She is exhibiting moderate uremic symptoms. At this time she has been scheduled for a left arm basilic transposition in a single stage. Apparently it is difficult to schedule cases within 30 days of being seen in the patient refused to return to the office secondary to her co-pay and therefore H&P is being dictated on day of surgery. Nothing has changed.  Current Facility-Administered Medications  Medication Dose Route Frequency Provider Last Rate Last Dose  . 0.9 %  sodium chloride infusion   Intravenous Continuous Precious Haws Piscitello, MD      . ceFAZolin (ANCEF) 2-4 GM/100ML-% IVPB           . ceFAZolin (ANCEF) IVPB 2g/100 mL premix  2 g Intravenous On Call to Taylor, PA-C      . Chlorhexidine Gluconate Cloth 2 % PADS 6 each  6 each Topical Once [employer], PA-C       And  . Chlorhexidine Gluconate Cloth 2 % PADS 6 each  6 each Topical Once [employer], PA-C      . famotidine (PEPCID) 20 MG tablet             Past Medical History:  Diagnosis Date  . Chronic kidney disease    kidney scaring  . Hypertension     Past Surgical History:  Procedure Laterality Date  . COLONOSCOPY    . TONSILLECTOMY      Social History Social History  Substance Use Topics  . Smoking status: Former Smoker    Quit date: 06/19/2013  . Smokeless tobacco: Never Used  . Alcohol use No    Family History History reviewed. No pertinent family history. No family history bleeding clotting disorders porphyria or autoimmune disease  Allergies  Allergen Reactions  . Altace [Ramipril] Anaphylaxis and Other (See Comments)    angioedema     REVIEW OF SYSTEMS (Negative  unless checked)  Constitutional: [] Weight loss  [] Fever  [] Chills Cardiac: [] Chest pain   [] Chest pressure   [] Palpitations   [] Shortness of breath when laying flat   [] Shortness of breath at rest   [] Shortness of breath with exertion. Vascular:  [] Pain in legs with walking   [] Pain in legs at rest   [] Pain in legs when laying flat   [] Claudication   [] Pain in feet when walking  [] Pain in feet at rest  [] Pain in feet when laying flat   [] History of DVT   [] Phlebitis   [] Swelling in legs   [] Varicose veins   [] Non-healing ulcers Pulmonary:   [] Uses home oxygen   [] Productive cough   [] Hemoptysis   [] Wheeze  [] COPD   [] Asthma Neurologic:  [] Dizziness  [] Blackouts   [] Seizures   [] History of stroke   [] History of TIA  [] Aphasia   [] Temporary blindness   [] Dysphagia   [] Weakness or numbness in arms   [] Weakness or numbness in legs Musculoskeletal:  [] Arthritis   [] Joint swelling   [] Joint pain   [] Low back pain Hematologic:  [] Easy bruising  [] Easy bleeding   [] Hypercoagulable state   [] Anemic  [] Hepatitis Gastrointestinal:  [] Blood in stool   [] Vomiting blood  [] Gastroesophageal reflux/heartburn   [] Difficulty swallowing. Genitourinary:  [] Chronic kidney disease   []   Difficult urination  [] Frequent urination  [] Burning with urination   [] Blood in urine Skin:  [] Rashes   [] Ulcers   [] Wounds Psychological:  [] History of anxiety   []  History of major depression.  Physical Examination  Vitals:   06/19/16 1334 06/30/16 0633  BP: 139/85 (!) 146/103  Pulse: (!) 58 67  Resp: 18 16  Temp:  98.6 F (37 C)  TempSrc:  Oral  SpO2: 100% 100%  Weight: 102.5 kg (226 lb) 99.8 kg (220 lb)  Height: 5\' 5"  (1.651 m) 5\' 5"  (1.651 m)   Body mass index is 36.61 kg/m. Gen: WD/WN, NAD Head: [place]/AT, No temporalis wasting. Prominent temp pulse not noted. Ear/Nose/Throat: Hearing grossly intact, nares w/o erythema or drainage, oropharynx w/o Erythema/Exudate,  Eyes: PERRLA, EOMI.  Neck: Supple, no nuchal rigidity.   No bruit or JVD.  Pulmonary:  Good air movement, clear to auscultation bilaterally, no use of accessory muscles.  Cardiac: RRR, normal S1, S2, no Murmurs, rubs or gallops. Vascular:  Vessel Right Left  Radial Palpable Palpable  Ulnar Palpable Palpable  Brachial Palpable Palpable  Carotid Palpable, without bruit Palpable, without bruit  Aorta Not palpable N/A  Femoral Palpable Palpable  Popliteal Palpable Palpable  PT Palpable Palpable  DP Palpable Palpable   Gastrointestinal: soft, non-tender/non-distended. No guarding/reflex.  Musculoskeletal: M/S 5/5 throughout.  Extremities without ischemic changes.  No deformity or atrophy.  Neurologic: CN 2-12 intact. Pain and light touch intact in extremities.  Symmetrical.  Speech is fluent. Motor exam as listed above. Psychiatric: Judgment intact, Mood & affect appropriate for pt's clinical situation. Dermatologic: No rashes or ulcers noted.  No cellulitis or open wounds. Lymph : No Cervical, Axillary, or Inguinal lymphadenopathy.    CBC Lab Results  Component Value Date   WBC 6.6 06/19/2016   HGB 11.0 (L) 06/19/2016   HCT 31.5 (L) 06/19/2016   MCV 88.6 06/19/2016   PLT 218 06/19/2016    BMET    Component Value Date/Time   NA 136 06/19/2016 1417   NA 138 02/14/2015 1716   K 4.5 06/19/2016 1417   K 3.7 02/14/2015 1716   CL 108 06/19/2016 1417   CL 102 02/14/2015 1716   CO2 21 (L) 06/19/2016 1417   CO2 24 02/14/2015 1716   GLUCOSE 91 06/19/2016 1417   GLUCOSE 118 (H) 02/14/2015 1716   BUN 67 (H) 06/19/2016 1417   BUN 26 (H) 02/14/2015 1716   CREATININE 7.74 (H) 06/19/2016 1417   CREATININE 2.60 (H) 02/15/2015 1449   CALCIUM 9.7 06/19/2016 1417   CALCIUM 9.9 02/14/2015 1716   GFRNONAA 6 (L) 06/19/2016 1417   GFRNONAA 23 (L) 02/15/2015 1449   GFRAA 7 (L) 06/19/2016 1417   GFRAA 26 (L) 02/15/2015 1449   Estimated Creatinine Clearance: 11.6 mL/min (by C-G formula based on SCr of 7.74 mg/dL).  COAG Lab Results  Component  Value Date   INR 1.00 06/19/2016    Assessment/Plan 1.  End-stage renal disease requiring hemodialysis:  Patient will undergo creation of a left arm basilic transposition. I been contacted by Dr. Johnney Ou and she was requesting also a catheter however do not have the facilities to place a catheter in the OR today. This will be arranged for next week. 2.  Hypertension:  Patient will continue medical management; nephrology is following no changes in oral medications.   Maria Chen, Dolores Lory, MD  06/30/2016 7:47 AM

## 2016-06-30 NOTE — Anesthesia Preprocedure Evaluation (Signed)
Anesthesia Evaluation  Patient identified by MRN, date of birth, ID band Patient awake    Reviewed: Allergy & Precautions, NPO status , Patient's Chart, lab work & pertinent test results, reviewed documented beta blocker date and time   Airway Mallampati: III  TM Distance: >3 FB     Dental  (+) Chipped   Pulmonary former smoker,           Cardiovascular hypertension, Pt. on medications and Pt. on home beta blockers      Neuro/Psych    GI/Hepatic   Endo/Other    Renal/GU ESRFRenal disease     Musculoskeletal   Abdominal   Peds  Hematology   Anesthesia Other Findings   Reproductive/Obstetrics                             Anesthesia Physical Anesthesia Plan  ASA: III  Anesthesia Plan: General   Post-op Pain Management:    Induction: Intravenous  Airway Management Planned: LMA  Additional Equipment:   Intra-op Plan:   Post-operative Plan:   Informed Consent: I have reviewed the patients History and Physical, chart, labs and discussed the procedure including the risks, benefits and alternatives for the proposed anesthesia with the patient or authorized representative who has indicated his/her understanding and acceptance.     Plan Discussed with: CRNA  Anesthesia Plan Comments:         Anesthesia Quick Evaluation

## 2016-06-30 NOTE — H&P (Signed)
[facility] History & Physical Update  The patient was interviewed and re-examined.  The patient's previous History and Physical has been reviewed and is unchanged.  There is no change in the plan of care. We plan to proceed with the scheduled procedure.  Schnier, Dolores Lory, MD  06/30/2016, 7:38 AM

## 2016-06-30 NOTE — Discharge Instructions (Signed)
Vascular Access for Hemodialysis A vascular access is a connection between two blood vessels that allows blood to be easily removed from the body and returned to the body during hemodialysis. Hemodialysis is a procedure in which a machine outside of the body filters the blood. There are three types of vascular accesses:   Arteriovenous fistula. This is a connection between an artery and a vein (usually in the arm) that is made by sewing them together. Blood in the artery flows directly into the vein, causing it to get larger over time. This makes it easier for the vein to be used for hemodialysis. An arteriovenous fistula takes 1-6 months to develop after surgery.   Arteriovenous graft. This is a connection between an artery and a vein in the arm that is made with a tube. An arteriovenous graft can be used within 2-3 weeks of surgery.   Venous catheter. This is a thin, flexible tube that is placed in a large vein (usually in the neck, chest, or groin). A venous catheter for hemodialysis contains two tubes that come out of the skin. A venous catheter can be used right away. It is usually used as a temporary access if you need hemodialysis before a fistula or graft has developed. It may also be used as a permanent access if a fistula or graft cannot be created. WHICH TYPE OF ACCESS IS BEST FOR ME? The type of access that is best for you depends on the size and strength of your veins.  A fistula is usually the preferred type of access. It can last several years and is less likely than the other types of accesses to become infected or to cause blood clots within a blood vessel (thrombosis). However, a fistula is not an option for everyone. If your veins are not the right size, a graft may be used instead. Grafts require you to have strong veins. If your veins are not strong enough for a graft, a catheter may be used. Catheters are more likely than fistulas and grafts to become infected or to have thrombosis.   Sometimes, only one type of access is an option. Your health care provider will help you determine which type of access is best for you.  HOW IS A VASCULAR ACCESS USED? The way the access is used depends on the type of access:   If the access is a fistula or graft, two needles are inserted through the skin into the access before each hemodialysis session. Blood leaves the body through one of the needles and travels through a tube to the hemodialysis machine (dialyzer). It then flows through another tube and returns to the body through the second needle.   If the access is a catheter, one tube is connected directly to the tube that leads to the dialyzer and the other is connected to a tube that leads away from the dialyzer. Blood leaves the body through one tube and returns to the body through the other.  WHAT KIND OF PROBLEMS CAN OCCUR WITH VASCULAR ACCESSES?  Blood clots within a blood vessel (thrombosis). Thrombosis can lead to a narrowing of a blood vessel or tube (stenosis). If thrombosis occurs frequently, another access site may be created as a backup.   Infection.  These problems are most likely to occur with a venous catheter and least likely to occur with an arteriovenous fistula.  HOW DO I CARE FOR MY VASCULAR ACCESS? Wear a medical alert bracelet. This tells health care providers that you are   a dialysis patient in the case of an emergency and allows them to care for your veins appropriately. If you have a graft or fistula:   A "bruit" is a noise that is heard with a stethoscope and a "thrill" is a vibration felt over the graft or fistula. The presence of the bruit and thrill indicates that the access is working. You will be taught to feel for the thrill each day. If this is not felt, the access may be clotted. Call your health care provider.   You may use the arm where your vascular access is located freely after the site heals. Keep the following in mind:   Avoid pressure on  the arm.   Avoid lifting heavy objects with the arm.   Avoid sleeping on the arm.   Avoid wearing tight-sleeved shirts or jewelry around the graft or fistula.   Do not allow blood pressure monitoring or needle punctures on the side where the graft or fistula is located.   With permission from your health care provider, you may do exercises to help with blood flow through a fistula. These exercises involve squeezing a rubber ball or other soft objects as instructed. SEEK MEDICAL CARE IF:   Chills develop.   You have an oral temperature above 102 F (38.9 C).  Swelling around the graft or fistula gets worse.   New pain develops.   Pus or other fluid (drainage) is seen at the vascular access site.   Skin redness or red streaking is seen on the skin around, above, or below the vascular access. SEEK IMMEDIATE MEDICAL CARE IF:   Pain, numbness, or an unusual pale skin color develops in the hand on the side of your fistula.   Dizziness or weakness develops that you have not had before.   The vascular access has bleeding that cannot be easily controlled.   This information is not intended to replace advice given to you by your health care provider. Make sure you discuss any questions you have with your health care provider.   Document Released: 01/20/2003 Document Revised: 11/20/2014 Document Reviewed: 03/18/2013 Elsevier Interactive Patient Education 2016 [place]   1) The drugs that you were given will stay in your system until tomorrow so for the next 24 hours you should not:  A) Drive an automobile B) Make any legal decisions C) Drink any alcoholic beverage   2) You may resume regular meals tomorrow.  Today it is better to start with liquids and gradually work up to solid foods.  You may eat anything you prefer, but it is better to start with liquids, then soup and crackers, and gradually work up to solid  foods.   3) Please notify your doctor immediately if you have any unusual bleeding, trouble breathing, redness and pain at the surgery site, drainage, fever, or pain not relieved by medication.    4) Additional Instructions:    Please contact your physician with any problems or Same Day Surgery at [phone], Monday through Friday 6 am to 4 pm, or [place] at [facility] number at [phone].

## 2016-06-30 NOTE — Anesthesia Procedure Notes (Signed)
Procedure Name: LMA Insertion Date/Time: 06/30/2016 8:14 AM Performed by: Courtney Paris Pre-anesthesia Checklist: Patient identified, Emergency Drugs available, Suction available, Patient being monitored and Timeout performed Patient Re-evaluated:Patient Re-evaluated prior to inductionOxygen Delivery Method: Circle system utilized Preoxygenation: Pre-oxygenation with 100% oxygen Intubation Type: IV induction and Combination inhalational/ intravenous induction Ventilation: Mask ventilation without difficulty LMA: LMA inserted LMA Size: 4.0 Tube type: Oral Number of attempts: 1 Placement Confirmation: positive ETCO2 and breath sounds checked- equal and bilateral Tube secured with: Tape Dental Injury: Teeth and Oropharynx as per pre-operative assessment

## 2016-06-30 NOTE — Transfer of Care (Signed)
Immediate Anesthesia Transfer of Care Note  Patient: Maria Chen  Procedure(s) Performed: Procedure(s): BASCILIC VEIN TRANSPOSITION (Left)  Patient Location: PACU  Anesthesia Type:General  Level of Consciousness: awake, oriented and patient cooperative  Airway & Oxygen Therapy: Patient Spontanous Breathing and Patient connected to face mask oxygen  Post-op Assessment: Report given to RN and Post -op Vital signs reviewed and stable  Post vital signs: stable  Last Vitals:  Vitals:   06/19/16 1334 06/30/16 0633  BP: 139/85 (!) 146/103  Pulse: (!) 58 67  Resp: 18 16  Temp:  37 C    Last Pain:  Vitals:   06/30/16 0633  TempSrc: Oral  PainSc: 7          Complications: No apparent anesthesia complications

## 2016-06-30 NOTE — Op Note (Signed)
OPERATIVE NOTE   PROCEDURE: left  basilic vein transposition (brachiobasilic arteriovenous fistula) placement  PRE-OPERATIVE DIAGNOSIS: Stage V renal insufficiency  POST-OPERATIVE DIAGNOSIS: Same  SURGEON: Katha Cabal, M.D.  ASSISTANT(S): Ms. Hezzie Bump  ANESTHESIA: general  ESTIMATED BLOOD LOSS: Minimal cc  FINDING(S): Basilic vein of good caliber  SPECIMEN(S):  None  INDICATIONS:   Maria Chen is a 40 y.o. female who presents with stage IV renal insufficiency. She is therefore undergoing creation of a fistula so that she will have adequate dialysis access in the future and avoid catheter placement..  The patient is scheduled for left second stage basilic vein transposition.  The patient is aware the risks include but are not limited to: bleeding, infection, steal syndrome, nerve damage, failure to mature, and need for additional procedures.  The patient is aware of the risks of the procedure and elects to proceed forward.  DESCRIPTION: After full informed written consent was obtained from the patient, the patient was brought back to the operating room and placed supine upon the operating table.  Prior to induction, the patient received IV antibiotics.   After obtaining adequate anesthesia, the patient's left arm was then prepped and draped in the standard fashion. Appropriate timeout is called  Preoperative vein mapping indicates a basilic vein which is 4 mm or larger throughout its course and therefore basilic transposition will be performed in a single stage.  I turned my attention first to identifying the patient's basilic vein.  I made an longitudinal incision over the vein from the level of the antecubital fossa up to its axillary extent.  I carefully dissected the vein away from its adjacent nerves.  The entirety of the basilic vein was mobilized and the vein was then marked with a surgical marker to maintain appropriate orientation. It was then irrigated with  heparinized saline.   A plane on top of the biceps fascia was dissected with electrocautery. The vein was then approximated to the skin in order to plan for the position of the arterial anastomosis.  The deep subcutaneous tissue was inspected for bleeding.  The basilic vein was then pulled subcutaneously using a Kelly clamp after a small counterincision was made at the apex of the tunnel.   The brachial artery is then dissected and looped with Silastic Vessel loops. Brachial artery is then controlled with the vessel loops and arteriotomy is made with an 11 blade and extended with Potts scissors and 6-0 Prolene stay sutures were placed. End vein to side brachial artery anastomosis is then fashioned with running 6-0 Prolene. Flushing maneuvers performed and flow was established through the fistula. Excellent thrill is noted within the vein 2+ radial pulses maintained at the wrist.   Bleeding was controlled with electrocautery and placement of large pieces of thrombin and gelfoam.  I washed out the surgical site after waiting a few minutes, and there was no further bleeding.  The fascia was reapproximated with interrupted stitches of 3-0 Vicryl to eliminate some of the deep space.  The superficial subcutaneous tissue was then reapproximated along the incision line with a running stitch of 3-0 Vicryl.  The skin was then reapproximated with a running subcuticular of 4-0 Monocryl.  The skin was then cleaned, dried, and reinforced with Dermabond.    The patient tolerated this procedure well.   COMPLICATIONS: None  CONDITION: Maria Chen  05/21/2015, 9:46 AM

## 2016-06-30 NOTE — Anesthesia Postprocedure Evaluation (Signed)
Anesthesia Post Note  Patient: Maria Chen  Procedure(s) Performed: Procedure(s) (LRB): [place] (Left)  Patient location during evaluation: PACU Anesthesia Type: General Level of consciousness: awake and alert Pain management: pain level controlled Vital Signs Assessment: post-procedure vital signs reviewed and stable Respiratory status: spontaneous breathing, nonlabored ventilation, respiratory function stable and patient connected to nasal cannula oxygen Cardiovascular status: blood pressure returned to baseline and stable Postop Assessment: no signs of nausea or vomiting Anesthetic complications: no    Last Vitals:  Vitals:   06/30/16 1401 06/30/16 1413  BP: (!) 151/88   Pulse: 72 70  Resp: 14 15  Temp:  36.7 C    Last Pain:  Vitals:   06/30/16 1413  TempSrc:   PainSc: 3                  Mcclain Shall S

## 2016-07-03 ENCOUNTER — Encounter: Payer: Self-pay | Admitting: Vascular Surgery

## 2016-07-03 ENCOUNTER — Other Ambulatory Visit: Payer: Self-pay | Admitting: Vascular Surgery

## 2016-07-04 ENCOUNTER — Ambulatory Visit: Admission: RE | Admit: 2016-07-04 | Payer: Medicaid Other | Source: Ambulatory Visit | Admitting: Vascular Surgery

## 2016-07-04 SURGERY — DIALYSIS/PERMA CATHETER INSERTION
Anesthesia: Moderate Sedation

## 2016-07-04 MED ORDER — DEXTROSE 5 % IV SOLN: 1.5000 g | INTRAVENOUS | Status: DC

## 2016-07-25 ENCOUNTER — Encounter
Admission: RE | Disposition: A | Discharge status: Home / Self Care | Payer: Self-pay | Source: Ambulatory Visit | Attending: Vascular Surgery

## 2016-07-25 ENCOUNTER — Ambulatory Visit
Admission: RE | Admit: 2016-07-25 | Discharge: 2016-07-25 | Disposition: A | Discharge status: Home / Self Care | Payer: Medicaid Other | Source: Ambulatory Visit | Attending: Vascular Surgery | Admitting: Vascular Surgery

## 2016-07-25 DIAGNOSIS — I120 Hypertensive chronic kidney disease with stage 5 chronic kidney disease or end stage renal disease: Secondary | ICD-10-CM | POA: Diagnosis not present

## 2016-07-25 DIAGNOSIS — Z992 Dependence on renal dialysis: Secondary | ICD-10-CM | POA: Insufficient documentation

## 2016-07-25 DIAGNOSIS — N186 End stage renal disease: Secondary | ICD-10-CM | POA: Diagnosis not present

## 2016-07-25 DIAGNOSIS — T82590A Other mechanical complication of surgically created arteriovenous fistula, initial encounter: Secondary | ICD-10-CM | POA: Diagnosis present

## 2016-07-25 DIAGNOSIS — Z888 Allergy status to other drugs, medicaments and biological substances status: Secondary | ICD-10-CM | POA: Diagnosis not present

## 2016-07-25 DIAGNOSIS — Z6835 Body mass index (BMI) 35.0-35.9, adult: Secondary | ICD-10-CM | POA: Insufficient documentation

## 2016-07-25 DIAGNOSIS — Y838 Other surgical procedures as the cause of abnormal reaction of the patient, or of later complication, without mention of misadventure at the time of the procedure: Secondary | ICD-10-CM | POA: Diagnosis not present

## 2016-07-25 HISTORY — PX: PERIPHERAL VASCULAR CATHETERIZATION: SHX172C

## 2016-07-25 LAB — POTASSIUM (ARMC VASCULAR LAB ONLY): Potassium (ARMC vascular lab): 4.4 (ref 3.5–5.1)

## 2016-07-25 SURGERY — DIALYSIS/PERMA CATHETER INSERTION
Anesthesia: Moderate Sedation

## 2016-07-25 MED ORDER — FENTANYL CITRATE (PF) 100 MCG/2ML IJ SOLN
INTRAMUSCULAR | Status: AC
  Filled 2016-07-25: qty 2

## 2016-07-25 MED ORDER — SODIUM CHLORIDE 0.9 % IV SOLN
INTRAVENOUS | Status: DC
  Administered 2016-07-25: 15:00:00 via INTRAVENOUS

## 2016-07-25 MED ORDER — MIDAZOLAM HCL 2 MG/2ML IJ SOLN
INTRAMUSCULAR | Status: DC | PRN
  Administered 2016-07-25 (×3): 1 mg via INTRAVENOUS
  Administered 2016-07-25: 2 mg via INTRAVENOUS

## 2016-07-25 MED ORDER — MIDAZOLAM HCL 5 MG/5ML IJ SOLN
INTRAMUSCULAR | Status: AC
  Filled 2016-07-25: qty 5

## 2016-07-25 MED ORDER — METHYLPREDNISOLONE SODIUM SUCC 125 MG IJ SOLR: 125.0000 mg | INTRAMUSCULAR | Status: DC | PRN

## 2016-07-25 MED ORDER — FENTANYL CITRATE (PF) 100 MCG/2ML IJ SOLN
INTRAMUSCULAR | Status: DC | PRN
  Administered 2016-07-25 (×2): 50 ug via INTRAVENOUS
  Administered 2016-07-25 (×2): 25 ug via INTRAVENOUS

## 2016-07-25 MED ORDER — ONDANSETRON HCL 4 MG/2ML IJ SOLN: 4.0000 mg | Freq: Four times a day (QID) | INTRAMUSCULAR | Status: DC | PRN

## 2016-07-25 MED ORDER — FAMOTIDINE 20 MG PO TABS: 40.0000 mg | ORAL_TABLET | ORAL | Status: DC | PRN

## 2016-07-25 MED ORDER — HEPARIN SODIUM (PORCINE) 10000 UNIT/ML IJ SOLN
INTRAMUSCULAR | Status: AC
  Filled 2016-07-25: qty 1

## 2016-07-25 MED ORDER — HYDROMORPHONE HCL 1 MG/ML IJ SOLN
1.0000 mg | Freq: Once | INTRAMUSCULAR | Status: DC
Start: 2016-07-25 — End: 2016-07-25

## 2016-07-25 SURGICAL SUPPLY — 4 items
CATH PALINDROME RT-P 15FX19CM (CATHETERS) ×2 IMPLANT
PACK ANGIOGRAPHY (CUSTOM PROCEDURE TRAY) ×2 IMPLANT
SET INTRO CAPELLA COAXIAL (SET/KITS/TRAYS/PACK) ×2 IMPLANT
TOWEL OR 17X26 4PK STRL BLUE (TOWEL DISPOSABLE) ×2 IMPLANT

## 2016-07-25 NOTE — Discharge Instructions (Signed)

## 2016-07-25 NOTE — H&P (Signed)
[facility] History & Physical Update  The patient was interviewed and re-examined.  The patient's previous History and Physical has been reviewed and is unchanged.  There is no change in the plan of care. We plan to proceed with the scheduled procedure.  Eyonna Sandstrom, Dolores Lory, MD  07/25/2016, 4:11 PM

## 2016-07-25 NOTE — Op Note (Signed)
OPERATIVE NOTE   PROCEDURE: 1. Insertion of tunneled dialysis catheter right IJ approach with ultrasound and fluoroscopic guidance.  PRE-OPERATIVE DIAGNOSIS: End-stage renal disease  POST-OPERATIVE DIAGNOSIS: Same  SURGEON: Kiara Keep, Dolores Lory.  ANESTHESIA: Conscious sedation was administered under my direct supervision. IV Versed plus fentanyl were utilized. Continuous ECG, pulse oximetry and blood pressure was monitored throughout the entire procedure.  Conscious sedation was for a total of 30 minutes.  ESTIMATED BLOOD LOSS: Minimal cc  CONTRAST USED:  None  FLUOROSCOPY TIME:  2 minutes  INDICATIONS:   Maria Chen a 40 y.o. y.o. female who presents with worsening renal function and is now starting hemodialysis she therefore requires a tunneled catheter because her fistula has not matured.  DESCRIPTION: After obtaining full informed written consent, the patient was positioned supine. The right neck and chest wall was prepped and draped in a sterile fashion. Ultrasound was placed in a sterile sleeve. Ultrasound was utilized to identify the right internal jugular vein which is noted to be echolucent and compressible indicating patency. Images recorded for the permanent record. Under real-time visualization a Seldinger needle is inserted into the vein and the guidewires advanced without difficulty. Small counterincision was made at the wire insertion site. Dilators are passed over the wire and the tunneled dialysis catheter is fed into the central venous system without difficulty.  Under fluoroscopy the catheter tip positioned at the atrial caval junction. The catheter is then approximated to the chest wall and an exit site selected. 1% lidocaine is infiltrated in soft tissues at this level small incision is made and the tunneling device is then passed from the exit site to the neck counterincision. Catheter is then connected to the tunneling device and the catheter was pulled  subcutaneously. It is then transected and the hub assembly connected without difficulty. Both lumens aspirate and flush easily. After verification of smooth contour with proper tip position under fluoroscopy the catheter is packed with 5000 units of heparin per lumen.  Catheter secured to the skin of the right chest wall with 0 silk. A sterile dressing is applied with a Biopatch.  COMPLICATIONS: None  CONDITION: Good  Maria Chen, Dolores Lory [place] [facility]. Office:  [phone]   07/25/2016,5:28 PM

## 2016-07-26 ENCOUNTER — Encounter: Payer: Self-pay | Admitting: Vascular Surgery

## 2016-07-26 ENCOUNTER — Encounter (INDEPENDENT_AMBULATORY_CARE_PROVIDER_SITE_OTHER): Payer: Self-pay

## 2016-08-22 ENCOUNTER — Other Ambulatory Visit (INDEPENDENT_AMBULATORY_CARE_PROVIDER_SITE_OTHER): Payer: Self-pay | Admitting: Vascular Surgery

## 2016-08-22 DIAGNOSIS — N186 End stage renal disease: Secondary | ICD-10-CM

## 2016-08-22 DIAGNOSIS — Z992 Dependence on renal dialysis: Principal | ICD-10-CM

## 2016-08-23 ENCOUNTER — Encounter (INDEPENDENT_AMBULATORY_CARE_PROVIDER_SITE_OTHER): Payer: Self-pay | Admitting: Vascular Surgery

## 2016-08-23 ENCOUNTER — Ambulatory Visit (INDEPENDENT_AMBULATORY_CARE_PROVIDER_SITE_OTHER): Payer: Medicaid Other | Admitting: Vascular Surgery

## 2016-08-23 ENCOUNTER — Ambulatory Visit (INDEPENDENT_AMBULATORY_CARE_PROVIDER_SITE_OTHER): Payer: Medicaid Other

## 2016-08-23 VITALS — BP 144/96 | HR 75 | Resp 17 | Ht 65.0 in | Wt 204.0 lb

## 2016-08-23 DIAGNOSIS — N186 End stage renal disease: Secondary | ICD-10-CM

## 2016-08-23 DIAGNOSIS — I10 Essential (primary) hypertension: Secondary | ICD-10-CM

## 2016-08-23 DIAGNOSIS — Z992 Dependence on renal dialysis: Secondary | ICD-10-CM

## 2016-08-23 DIAGNOSIS — T82318A Breakdown (mechanical) of other vascular grafts, initial encounter: Secondary | ICD-10-CM

## 2016-08-23 DIAGNOSIS — T829XXA Unspecified complication of cardiac and vascular prosthetic device, implant and graft, initial encounter: Secondary | ICD-10-CM

## 2016-08-23 DIAGNOSIS — Y841 Kidney dialysis as the cause of abnormal reaction of the patient, or of later complication, without mention of misadventure at the time of the procedure: Secondary | ICD-10-CM

## 2016-08-24 DIAGNOSIS — Z992 Dependence on renal dialysis: Principal | ICD-10-CM

## 2016-08-24 DIAGNOSIS — T829XXA Unspecified complication of cardiac and vascular prosthetic device, implant and graft, initial encounter: Secondary | ICD-10-CM | POA: Insufficient documentation

## 2016-08-24 DIAGNOSIS — N186 End stage renal disease: Secondary | ICD-10-CM | POA: Insufficient documentation

## 2016-08-24 DIAGNOSIS — I10 Essential (primary) hypertension: Secondary | ICD-10-CM | POA: Insufficient documentation

## 2016-08-24 NOTE — Progress Notes (Signed)
Subjective:    Patient ID: Maria Chen, female    DOB: 1976-07-31, 40 y.o.   MRN: [id number] Chief Complaint  Patient presents with  . Re-evaluation    Ultrasound follow up    Patient presents for her first post-operative visit. She is s/p left basilic vein transposition on 06/30/16. She is without complaint. Her post-operative course has been uneventful. She is currently being maintained by a right IJ permcath without complication. Patient underwent a left HDA which was notable for a tortuous and narrowed distal aspect of the fistula. The patient denies any fistula skin breakdown, pain, edema, pallor or ulceration of the arm / hand.    Review of Systems  Constitutional: Negative.   HENT: Negative.   Eyes: Negative.   Respiratory: Negative.   Cardiovascular: Negative.   Gastrointestinal: Negative.   Endocrine: Negative.   Genitourinary:       ESRD  Musculoskeletal: Negative.   Skin: Negative.   Allergic/Immunologic: Negative.   Neurological: Negative.   Hematological: Negative.   Psychiatric/Behavioral: Negative.       Objective:   Physical Exam  Constitutional: She is oriented to person, place, and time. She appears well-developed and well-nourished.  HENT:  Head: Normocephalic and atraumatic.  Eyes: Conjunctivae and EOM are normal. Pupils are equal, round, and reactive to light.  Neck: Normal range of motion.  Cardiovascular: Normal rate, regular rhythm, normal heart sounds and intact distal pulses.   Left Brachiobasilic Fistula: Incision healed well. Good bruit and thrill.   Pulmonary/Chest: Effort normal and breath sounds normal.  Abdominal: Soft. Bowel sounds are normal.  Musculoskeletal: Normal range of motion.  Neurological: She is alert and oriented to person, place, and time. She has normal reflexes.  Skin: Skin is warm and dry.  Psychiatric: She has a normal mood and affect. Her behavior is normal. Judgment and thought content normal.   BP (!) 144/96  (BP Location: Right Arm)   Pulse 75   Resp 17   Ht 5\' 5"  (1.651 m)   Wt 204 lb (92.5 kg)   BMI 33.95 kg/m   Past Medical History:  Diagnosis Date  . Chronic kidney disease    kidney scaring  . Hypertension    Social History   Social History  . Marital status: Single    Spouse name: N/A  . Number of children: N/A  . Years of education: N/A   Occupational History  . Not on file.   Social History Main Topics  . Smoking status: Former Smoker    Quit date: 06/19/2013  . Smokeless tobacco: Never Used  . Alcohol use No  . Drug use: No  . Sexual activity: Not on file   Other Topics Concern  . Not on file   Social History Narrative  . No narrative on file   Past Surgical History:  Procedure Laterality Date  . BASCILIC VEIN TRANSPOSITION Left 06/30/2016   Procedure: BASCILIC VEIN TRANSPOSITION;  Surgeon: Katha Cabal, MD;  Location: [facility];  Service: Vascular;  Laterality: Left;  . COLONOSCOPY    . PERIPHERAL VASCULAR CATHETERIZATION N/A 07/25/2016   Procedure: Dialysis/Perma Catheter Insertion;  Surgeon: Katha Cabal, MD;  Location: [place] [facility];  Service: Cardiovascular;  Laterality: N/A;  . TONSILLECTOMY     Family History  Problem Relation Age of Onset  . Adopted: Yes   Allergies  Allergen Reactions  . Altace [Ramipril] Anaphylaxis and Other (See Comments)    angioedema      Assessment &  Plan:  Patient presents for her first post-operative visit. She is s/p left basilic vein transposition on 06/30/16. She is without complaint. Her post-operative course has been uneventful. She is currently being maintained by a right IJ permcath without complication. Patient underwent a left HDA which was notable for a tortuous and narrowed distal aspect of the fistula. The patient denies any fistula skin breakdown, pain, edema, pallor or ulceration of the arm / hand.   1. ESRD on dialysis ([place]) - Stable Currently being maintained by a right IJ permcath without  complication.  2. Complication of arteriovenous dialysis fistula, initial encounter - New Recommend left upper extremity fistulogram to assess anatomy of fistula and correct tortuosity and narrowing of the distal aspect of the fistula so the patient may dialyze appropriately.   3. Essential hypertension Encouraged good control as its slows the progression of atherosclerotic disease  Current Outpatient Prescriptions on File Prior to Visit  Medication Sig Dispense Refill  . amLODipine (NORVASC) 10 MG tablet Take 10 mg by mouth daily.  11  . CVS D3 1000 units capsule Take 1 capsule by mouth daily.  11  . metoprolol succinate (TOPROL-XL) 25 MG 24 hr tablet Take 25 mg by mouth daily.  11  . oxyCODONE-acetaminophen (ROXICET) 5-325 MG tablet Take 1-2 tablets by mouth every 6 (six) hours as needed for moderate pain or severe pain. 60 tablet 0  . spironolactone (ALDACTONE) 25 MG tablet Take 25 mg by mouth daily.   11   Current Facility-Administered Medications on File Prior to Visit  Medication Dose Route Frequency Provider Last Rate Last Dose  . cefUROXime (ZINACEF) 1.5 g in dextrose 5 % 50 mL IVPB  1.5 g Intravenous 30 min Pre-Op Janalyn Harder Saniyyah Elster, PA-C        There are no Patient Instructions on file for this visit. No Follow-up on file.   Malinda Mayden A Teola Felipe, PA-C

## 2016-08-25 ENCOUNTER — Encounter (INDEPENDENT_AMBULATORY_CARE_PROVIDER_SITE_OTHER): Payer: Self-pay

## 2016-08-25 ENCOUNTER — Other Ambulatory Visit: Payer: Self-pay | Admitting: Student

## 2016-08-28 ENCOUNTER — Other Ambulatory Visit (INDEPENDENT_AMBULATORY_CARE_PROVIDER_SITE_OTHER): Payer: Self-pay | Admitting: Vascular Surgery

## 2016-08-30 ENCOUNTER — Encounter
Admission: RE | Disposition: A | Discharge status: Home / Self Care | Payer: Self-pay | Source: Ambulatory Visit | Attending: Vascular Surgery

## 2016-08-30 ENCOUNTER — Ambulatory Visit
Admission: RE | Admit: 2016-08-30 | Discharge: 2016-08-30 | Disposition: A | Discharge status: Home / Self Care | Payer: Medicaid Other | Source: Ambulatory Visit | Attending: Vascular Surgery | Admitting: Vascular Surgery

## 2016-08-30 ENCOUNTER — Encounter: Payer: Self-pay | Admitting: *Deleted

## 2016-08-30 DIAGNOSIS — Y832 Surgical operation with anastomosis, bypass or graft as the cause of abnormal reaction of the patient, or of later complication, without mention of misadventure at the time of the procedure: Secondary | ICD-10-CM | POA: Insufficient documentation

## 2016-08-30 DIAGNOSIS — Z992 Dependence on renal dialysis: Secondary | ICD-10-CM | POA: Diagnosis not present

## 2016-08-30 DIAGNOSIS — Z87891 Personal history of nicotine dependence: Secondary | ICD-10-CM | POA: Insufficient documentation

## 2016-08-30 DIAGNOSIS — T82898A Other specified complication of vascular prosthetic devices, implants and grafts, initial encounter: Secondary | ICD-10-CM | POA: Diagnosis present

## 2016-08-30 DIAGNOSIS — T82858A Stenosis of vascular prosthetic devices, implants and grafts, initial encounter: Secondary | ICD-10-CM | POA: Diagnosis not present

## 2016-08-30 DIAGNOSIS — Z888 Allergy status to other drugs, medicaments and biological substances status: Secondary | ICD-10-CM | POA: Insufficient documentation

## 2016-08-30 DIAGNOSIS — I120 Hypertensive chronic kidney disease with stage 5 chronic kidney disease or end stage renal disease: Secondary | ICD-10-CM | POA: Diagnosis not present

## 2016-08-30 DIAGNOSIS — N186 End stage renal disease: Secondary | ICD-10-CM | POA: Diagnosis not present

## 2016-08-30 HISTORY — PX: PERIPHERAL VASCULAR CATHETERIZATION: SHX172C

## 2016-08-30 LAB — POTASSIUM (ARMC VASCULAR LAB ONLY): POTASSIUM (ARMC VASCULAR LAB): 3.2 — AB (ref 3.5–5.1)

## 2016-08-30 SURGERY — A/V SHUNTOGRAM/FISTULAGRAM
Anesthesia: Moderate Sedation | Laterality: Left

## 2016-08-30 MED ORDER — DEXTROSE 5 % IV SOLN
1.5000 g | INTRAVENOUS | Status: AC
  Administered 2016-08-30: 1.5 g via INTRAVENOUS

## 2016-08-30 MED ORDER — NITROGLYCERIN 1 MG/10 ML FOR IR/CATH LAB
INTRA_ARTERIAL | Status: DC | PRN
  Administered 2016-08-30 (×3): 500 ug via INTRA_ARTERIAL

## 2016-08-30 MED ORDER — FENTANYL CITRATE (PF) 100 MCG/2ML IJ SOLN
INTRAMUSCULAR | Status: AC
  Filled 2016-08-30: qty 4

## 2016-08-30 MED ORDER — FLUCONAZOLE 50 MG PO TABS
150.0000 mg | ORAL_TABLET | Freq: Every day | ORAL | Status: DC
  Administered 2016-08-30: 150 mg via ORAL
  Filled 2016-08-30 (×2): qty 3

## 2016-08-30 MED ORDER — NITROGLYCERIN 5 MG/ML IV SOLN
INTRAVENOUS | Status: AC
Start: 2016-08-30 — End: 2016-08-30
  Filled 2016-08-30: qty 10

## 2016-08-30 MED ORDER — LIDOCAINE-EPINEPHRINE (PF) 1 %-1:200000 IJ SOLN
INTRAMUSCULAR | Status: AC
  Filled 2016-08-30: qty 30

## 2016-08-30 MED ORDER — FAMOTIDINE 20 MG PO TABS: 40.0000 mg | ORAL_TABLET | ORAL | Status: DC | PRN

## 2016-08-30 MED ORDER — SODIUM CHLORIDE 0.9 % IV SOLN: INTRAVENOUS | Status: DC

## 2016-08-30 MED ORDER — HYDROCODONE-ACETAMINOPHEN 5-325 MG PO TABS: 1.0000 | ORAL_TABLET | Freq: Four times a day (QID) | ORAL | 0 refills | Status: AC | PRN

## 2016-08-30 MED ORDER — HEPARIN SODIUM (PORCINE) 1000 UNIT/ML IJ SOLN
INTRAMUSCULAR | Status: AC
  Filled 2016-08-30: qty 1

## 2016-08-30 MED ORDER — HEPARIN SODIUM (PORCINE) 1000 UNIT/ML IJ SOLN
INTRAMUSCULAR | Status: DC | PRN
  Administered 2016-08-30: 4000 [IU] via INTRAVENOUS

## 2016-08-30 MED ORDER — METHYLPREDNISOLONE SODIUM SUCC 125 MG IJ SOLR: 125.0000 mg | INTRAMUSCULAR | Status: DC | PRN

## 2016-08-30 MED ORDER — SODIUM CHLORIDE 0.9 % IJ SOLN
INTRAMUSCULAR | Status: AC
  Filled 2016-08-30: qty 50

## 2016-08-30 MED ORDER — ONDANSETRON HCL 4 MG/2ML IJ SOLN: 4.0000 mg | Freq: Four times a day (QID) | INTRAMUSCULAR | Status: DC | PRN

## 2016-08-30 MED ORDER — HYDROMORPHONE HCL 1 MG/ML IJ SOLN: 1.0000 mg | Freq: Once | INTRAMUSCULAR | Status: DC

## 2016-08-30 MED ORDER — MIDAZOLAM HCL 5 MG/5ML IJ SOLN
INTRAMUSCULAR | Status: AC
  Filled 2016-08-30: qty 5

## 2016-08-30 MED ORDER — MIDAZOLAM HCL 2 MG/2ML IJ SOLN
INTRAMUSCULAR | Status: DC | PRN
  Administered 2016-08-30: 1 mg via INTRAVENOUS
  Administered 2016-08-30: 0.5 mg via INTRAVENOUS
  Administered 2016-08-30: 2 mg via INTRAVENOUS
  Administered 2016-08-30: 1 mg via INTRAVENOUS

## 2016-08-30 MED ORDER — FENTANYL CITRATE (PF) 100 MCG/2ML IJ SOLN
INTRAMUSCULAR | Status: DC | PRN
  Administered 2016-08-30 (×2): 50 ug via INTRAVENOUS
  Administered 2016-08-30 (×3): 25 ug via INTRAVENOUS

## 2016-08-30 SURGICAL SUPPLY — 17 items
BALLN LUTONIX AV 8X40X75 (BALLOONS) ×2
BALLN LUTONIX DCB 5X40X130 (BALLOONS) ×2
BALLN LUTONIX DCB 7X40X130 (BALLOONS) ×2
BALLN ULTRVRSE 7X40X75C (BALLOONS) ×2
BALLOON LUTONIX AV 8X40X75 (BALLOONS) ×1 IMPLANT
BALLOON LUTONIX DCB 5X40X130 (BALLOONS) ×1 IMPLANT
BALLOON LUTONIX DCB 7X40X130 (BALLOONS) ×1 IMPLANT
BALLOON ULTRVRSE 7X40X75C (BALLOONS) ×1 IMPLANT
CATH TORCON 5FR 0.38 (CATHETERS) ×2 IMPLANT
DEVICE PRESTO INFLATION (MISCELLANEOUS) ×2 IMPLANT
DRAPE BRACHIAL (DRAPES) ×2 IMPLANT
GUIDEWIRE ANGLED .035 180CM (WIRE) ×2 IMPLANT
PACK ANGIOGRAPHY (CUSTOM PROCEDURE TRAY) ×2 IMPLANT
SET INTRO CAPELLA COAXIAL (SET/KITS/TRAYS/PACK) ×2 IMPLANT
SHEATH BRITE TIP 6FRX5.5 (SHEATH) ×2 IMPLANT
TOWEL OR 17X26 4PK STRL BLUE (TOWEL DISPOSABLE) ×2 IMPLANT
WIRE MAGIC TORQUE 260C (WIRE) ×2 IMPLANT

## 2016-08-30 NOTE — H&P (Signed)
[facility] History & Physical Update  The patient was interviewed and re-examined.  The patient's previous History and Physical has been reviewed and is unchanged.  There is no change in the plan of care. We plan to proceed with the scheduled procedure.  Hortencia Pilar, MD  08/30/2016, 8:14 AM

## 2016-08-30 NOTE — Op Note (Signed)
OPERATIVE NOTE   PROCEDURE: 1. Contrast injection left arm basilic transposition  AV access 2. Percutaneous transluminal angioplasty to 5 mm arterial anastomosis 3. Percutaneous transluminal angioplasty to 7 mm midportion of the fistula 4. Percutaneous transluminal angioplasty to 8 mm axillary vein; central vein  PRE-OPERATIVE DIAGNOSIS: Complication of dialysis access                                                       End Stage Renal Disease  POST-OPERATIVE DIAGNOSIS: same as above   SURGEON: Katha Cabal, M.D.  ANESTHESIA: Conscious sedation was administered under my direct supervision. IV Versed plus fentanyl were utilized. Continuous ECG, pulse oximetry and blood pressure was monitored throughout the entire procedure.  Conscious sedation was for a total of 70.  ESTIMATED BLOOD LOSS: minimal  FINDING(S): Stricture of the AV graft  SPECIMEN(S):  None  CONTRAST: 55 cc  FLUOROSCOPY TIME: 6.9 minutes  INDICATIONS: Maria Chen is a 40 y.o. female who  presents with malfunctioning left basilic AV access.  The patient is scheduled for angiography with possible intervention of the AV access.  The patient is aware the risks include but are not limited to: bleeding, infection, thrombosis of the cannulated access, and possible anaphylactic reaction to the contrast.  The patient acknowledges if the access can not be salvaged a tunneled catheter will be needed and will be placed during this procedure.  The patient is aware of the risks of the procedure and elects to proceed with the angiogram and intervention.  DESCRIPTION: After full informed written consent was obtained, the patient was brought back to the Special Procedure suite and placed supine position.  Appropriate cardiopulmonary monitors were placed.  The left arm was prepped and draped in the standard fashion.  Appropriate timeout is called. The more proximal portion of the basilic transposition  was cannulated with a  micropuncture needle in a retrograde direction.  Access was performed with ultrasound guidance. The fistula was noted to be echolucent and compressible indicating patency image was recorded for the permanent record and the puncture was performed under direct ultrasound visualization. The microwire was advanced and the needle was exchanged for  a microsheath.  The J-wire was then advanced and a 6 Fr sheath inserted. Glidewire and Kumpe catheter were then negotiated into the brachial artery. Hand injections were completed to image the access from the arterial anastomosis through the entire access.  The central venous structures were also imaged by hand injections.  Based on the images,  4000 units of heparin was given and a wire was negotiated through the stricture at the arterial anastomosis and a 5 x 4 Lutonix balloon was advanced across the lesion positioning half of the balloon within the brachial artery and half of it within the fistula. Inflation was to 12 atm for 2 minutes. The balloon was then deflated repositioned slightly and a second inflation to 12 atm for 2 minutes was performed with the balloon entirely within the fistula. KMP catheter was then reintroduced and imaging was performed. And plasty was successful and attention was then turned to the stricture at the level of the axillary vein. Attempts at flipping the sheath from a retrograde direction 20 antegrade direction were unsuccessful and a pursestring suture of 4-0 Monocryl was placed.   A site was selected in the distal  fistula near the arterial anastomosis and a sheath was inserted in the antegrade direction.   Access was performed with ultrasound guidance. The fistula was noted to be echolucent and compressible indicating patency image was recorded for the permanent record and the puncture was performed under direct ultrasound visualization. The microwire was advanced and the needle was exchanged for  a microsheath.  The J-wire was then  advanced and a 6 Fr sheath inserted. Magic torque wires then advanced and a stricture within the venous portion of the fistula is treated with a 7 x 4 Lutonix balloon. It should also be noted that several doses of 500 g of nitroglycerin were given as well throughout the course of this case.Marland Kitchen  An 8 x 40 Lutonix balloon was used the axillary vein.  Inflations were to 14 atm for 2 minutes.  Follow-up imaging demonstrates complete resolution of the stricture with rapid flow of contrast through the fistula, the central venous anatomy is improved as well. However, a moderate-sized hematoma has now occurred at the original puncture site. A balloon was placed over this area and inflated to just 6 atm for several minutes and pressure was held. Unfortunately, at the conclusion of the procedure there is still a moderate-sized hematoma  A 4-0 Monocryl purse-string suture was sewn around the second sheath.  The sheath was removed and light pressure was applied.  A sterile bandage was applied to the puncture site.    COMPLICATIONS: Moderate-sized hematoma at the initial puncture site  CONDITION: Maria Chen, M.D [facility] Office: [phone]  08/30/2016 9:46 AM

## 2016-08-31 ENCOUNTER — Encounter: Payer: Self-pay | Admitting: Vascular Surgery

## 2016-09-11 ENCOUNTER — Other Ambulatory Visit (INDEPENDENT_AMBULATORY_CARE_PROVIDER_SITE_OTHER): Payer: Self-pay | Admitting: Vascular Surgery

## 2016-09-11 DIAGNOSIS — T829XXD Unspecified complication of cardiac and vascular prosthetic device, implant and graft, subsequent encounter: Secondary | ICD-10-CM

## 2016-09-11 DIAGNOSIS — N186 End stage renal disease: Secondary | ICD-10-CM

## 2016-09-25 ENCOUNTER — Ambulatory Visit (INDEPENDENT_AMBULATORY_CARE_PROVIDER_SITE_OTHER): Payer: Medicaid Other | Admitting: Vascular Surgery

## 2016-09-25 ENCOUNTER — Ambulatory Visit (INDEPENDENT_AMBULATORY_CARE_PROVIDER_SITE_OTHER): Payer: Medicaid Other

## 2016-09-25 ENCOUNTER — Encounter (INDEPENDENT_AMBULATORY_CARE_PROVIDER_SITE_OTHER): Payer: Self-pay | Admitting: Vascular Surgery

## 2016-09-25 VITALS — BP 148/92 | HR 66 | Resp 16 | Ht 65.0 in | Wt 196.0 lb

## 2016-09-25 DIAGNOSIS — Z992 Dependence on renal dialysis: Secondary | ICD-10-CM

## 2016-09-25 DIAGNOSIS — T829XXS Unspecified complication of cardiac and vascular prosthetic device, implant and graft, sequela: Secondary | ICD-10-CM

## 2016-09-25 DIAGNOSIS — T829XXD Unspecified complication of cardiac and vascular prosthetic device, implant and graft, subsequent encounter: Secondary | ICD-10-CM | POA: Diagnosis not present

## 2016-09-25 DIAGNOSIS — N186 End stage renal disease: Secondary | ICD-10-CM

## 2016-09-25 DIAGNOSIS — I10 Essential (primary) hypertension: Secondary | ICD-10-CM

## 2016-09-25 NOTE — Progress Notes (Signed)
MRN : [id number]  Maria Chen is a 40 y.o. (10/23/1976) female who presents with chief complaint of  Chief Complaint  Patient presents with  . Re-evaluation    Ultrasound follow up  .  History of Present Illness: The patient returns to the office for followup status post intervention of the dialysis access.  At angiography angioplasty of both the arterial inflow as well as the venous outflow was performed. The procedure was complicated with a moderate hematoma secondary to the retrograde puncture site. Following the intervention the access function has significantly improved, with better flow rates and improved KT/V. The patient has not been experiencing increased bleeding times following decannulation and the patient denies increased recirculation. The patient denies an increase in arm swelling. At the present time the patient denies hand pain.  The patient denies amaurosis fugax or recent TIA symptoms. There are no recent neurological changes noted. The patient denies claudication symptoms or rest pain symptoms. The patient denies history of DVT, PE or superficial thrombophlebitis. The patient denies recent episodes of angina or shortness of breath.   Duplex ultrasound of the left arm basilic transposition done today shows moderately elevated velocities at the venous outflow however the velocities throughout the entire access are somewhat elevated.  Current Meds  Medication Sig  . amLODipine (NORVASC) 10 MG tablet Take 10 mg by mouth daily.  . CVS D3 1000 units capsule Take 1 capsule by mouth daily.  Marland Kitchen HYDROcodone-acetaminophen (NORCO) 5-325 MG tablet Take 1-2 tablets by mouth every 6 (six) hours as needed for moderate pain or severe pain.  . metoprolol succinate (TOPROL-XL) 25 MG 24 hr tablet Take 25 mg by mouth daily.  . multivitamin (RENA-VIT) TABS tablet Take 1 tablet by mouth daily.  Marland Kitchen oxyCODONE-acetaminophen (ROXICET) 5-325 MG tablet Take 1-2 tablets by mouth every 6  (six) hours as needed for moderate pain or severe pain.  . sevelamer carbonate (RENVELA) 800 MG tablet Take 800 mg by mouth.  . spironolactone (ALDACTONE) 25 MG tablet Take 25 mg by mouth daily.     Past Medical History:  Diagnosis Date  . Chronic kidney disease    kidney scaring  . Hypertension     Past Surgical History:  Procedure Laterality Date  . BASCILIC VEIN TRANSPOSITION Left 06/30/2016   Procedure: BASCILIC VEIN TRANSPOSITION;  Surgeon: Katha Cabal, MD;  Location: [facility];  Service: Vascular;  Laterality: Left;  . COLONOSCOPY    . PERIPHERAL VASCULAR CATHETERIZATION N/A 07/25/2016   Procedure: Dialysis/Perma Catheter Insertion;  Surgeon: Katha Cabal, MD;  Location: [place] [facility];  Service: Cardiovascular;  Laterality: N/A;  . PERIPHERAL VASCULAR CATHETERIZATION Left 08/30/2016   Procedure: A/V Shuntogram/Fistulagram;  Surgeon: Katha Cabal, MD;  Location: [place] [facility];  Service: Cardiovascular;  Laterality: Left;  . perm cath insertion    . TONSILLECTOMY      Social History Social History  Substance Use Topics  . Smoking status: Former Smoker    Quit date: 06/19/2013  . Smokeless tobacco: Never Used  . Alcohol use No    Family History Family History  Problem Relation Age of Onset  . Adopted: Yes  No family history available secondary to patient being adopted   Allergies  Allergen Reactions  . Altace [Ramipril] Anaphylaxis and Other (See Comments)    angioedema     REVIEW OF SYSTEMS (Negative unless checked)  Constitutional: [] Weight loss  [] Fever  [] Chills Cardiac: [] Chest pain   [] Chest pressure   []   Palpitations   [] Shortness of breath when laying flat   [] Shortness of breath with exertion. Vascular:  [] Pain in legs with walking   [] Pain in legs at rest  [] History of DVT   [] Phlebitis   [] Swelling in legs   [] Varicose veins   [] Non-healing ulcers Pulmonary:   [] Uses home oxygen   [] Productive cough   [] Hemoptysis   [] Wheeze   [] COPD   [] Asthma Neurologic:  [] Dizziness   [] Seizures   [] History of stroke   [] History of TIA  [] Aphasia   [] Vissual changes   [] Weakness or numbness in arm   [] Weakness or numbness in leg Musculoskeletal:   [] Joint swelling   [] Joint pain   [] Low back pain Hematologic:  [] Easy bruising  [] Easy bleeding   [] Hypercoagulable state   [] Anemic Gastrointestinal:  [] Diarrhea   [] Vomiting  [] Gastroesophageal reflux/heartburn   [] Difficulty swallowing. Genitourinary:  [x] Chronic kidney disease   [] Difficult urination  [] Frequent urination   [] Blood in urine Skin:  [] Rashes   [] Ulcers  Psychological:  [] History of anxiety   []  History of major depression.  Physical Examination  Vitals:   09/25/16 1142  BP: (!) 148/92  Pulse: 66  Resp: 16  Weight: 196 lb (88.9 kg)  Height: 5\' 5"  (1.651 m)   Body mass index is 32.62 kg/m. Gen: WD/WN, NAD Head: [place]/AT, No temporalis wasting.  Ear/Nose/Throat: Hearing grossly intact, nares w/o erythema or drainage, poor dentition Eyes: PER, EOMI, sclera nonicteric.  Neck: Supple, no masses.  No bruit or JVD.  Pulmonary:  Good air movement, clear to auscultation bilaterally, no use of accessory muscles.  Cardiac: RRR, normal S1, S2, no Murmurs. Vascular: The left arm basilic transposition has an excellent thrill and bruit. There is minimal pulsatility. The fistula itself is fairly easy to palpate  Vessel Right Left  Radial Palpable Palpable  Ulnar Palpable Palpable  Brachial Palpable Palpable  Carotid Palpable Palpable  Gastrointestinal: soft, non-distended. No guarding/no peritoneal signs.  Musculoskeletal: M/S 5/5 throughout.  No deformity or atrophy.  Neurologic: CN 2-12 intact. Pain and light touch intact in extremities.  Symmetrical.  Speech is fluent. Motor exam as listed above. Psychiatric: Judgment intact, Mood & affect appropriate for pt's clinical situation. Dermatologic: No rashes or ulcers noted.  No changes consistent with cellulitis. Lymph :  No Cervical lymphadenopathy, no lichenification or skin changes of chronic lymphedema.  CBC Lab Results  Component Value Date   WBC 6.6 06/19/2016   HGB 11.0 (L) 06/19/2016   HCT 31.5 (L) 06/19/2016   MCV 88.6 06/19/2016   PLT 218 06/19/2016    BMET    Component Value Date/Time   NA 136 06/19/2016 1417   NA 138 02/14/2015 1716   K 4.5 06/19/2016 1417   K 3.7 02/14/2015 1716   CL 108 06/19/2016 1417   CL 102 02/14/2015 1716   CO2 21 (L) 06/19/2016 1417   CO2 24 02/14/2015 1716   GLUCOSE 91 06/19/2016 1417   GLUCOSE 118 (H) 02/14/2015 1716   BUN 67 (H) 06/19/2016 1417   BUN 26 (H) 02/14/2015 1716   CREATININE 7.74 (H) 06/19/2016 1417   CREATININE 2.60 (H) 02/15/2015 1449   CALCIUM 9.7 06/19/2016 1417   CALCIUM 9.9 02/14/2015 1716   GFRNONAA 6 (L) 06/19/2016 1417   GFRNONAA 23 (L) 02/15/2015 1449   GFRAA 7 (L) 06/19/2016 1417   GFRAA 26 (L) 02/15/2015 1449   CrCl cannot be calculated (Patient's most recent lab result is older than the maximum 21 days allowed.).  COAG  Lab Results  Component Value Date   INR 1.00 06/19/2016    Radiology No results found.  Assessment/Plan 1. ESRD on dialysis [facility]) Recommend:  The patient is doing well and currently has adequate dialysis access. The patient's dialysis center is not reporting any access issues. Flow pattern is stable when compared to the prior ultrasound.  The patient should have a duplex ultrasound of the dialysis access in 3 months. The patient will follow-up with me in the office after each ultrasound   - VAS [place] (AVF,AVG); Future  2. Complication of arteriovenous dialysis fistula, sequela See #1 - VAS US DUPLEX DIALYSIS ACCESS (AVF,AVG); Future  3. Essential hypertension Continue antihypertensive medications as already ordered and reviewed, no changes at this time.    Hortencia Pilar, MD  09/25/2016 12:46 PM

## 2016-10-24 ENCOUNTER — Encounter (INDEPENDENT_AMBULATORY_CARE_PROVIDER_SITE_OTHER): Payer: Self-pay

## 2016-10-25 ENCOUNTER — Other Ambulatory Visit (INDEPENDENT_AMBULATORY_CARE_PROVIDER_SITE_OTHER): Payer: Self-pay | Admitting: Vascular Surgery

## 2016-10-31 ENCOUNTER — Ambulatory Visit
Admission: RE | Admit: 2016-10-31 | Discharge: 2016-10-31 | Disposition: A | Discharge status: Home / Self Care | Payer: Medicare Other | Source: Ambulatory Visit | Attending: Vascular Surgery | Admitting: Vascular Surgery

## 2016-10-31 ENCOUNTER — Encounter
Admission: RE | Disposition: A | Discharge status: Home / Self Care | Payer: Self-pay | Source: Ambulatory Visit | Attending: Vascular Surgery

## 2016-10-31 DIAGNOSIS — Z452 Encounter for adjustment and management of vascular access device: Secondary | ICD-10-CM | POA: Diagnosis present

## 2016-10-31 DIAGNOSIS — E1122 Type 2 diabetes mellitus with diabetic chronic kidney disease: Secondary | ICD-10-CM | POA: Insufficient documentation

## 2016-10-31 DIAGNOSIS — Z888 Allergy status to other drugs, medicaments and biological substances status: Secondary | ICD-10-CM | POA: Diagnosis not present

## 2016-10-31 DIAGNOSIS — Z87891 Personal history of nicotine dependence: Secondary | ICD-10-CM | POA: Insufficient documentation

## 2016-10-31 DIAGNOSIS — Z992 Dependence on renal dialysis: Secondary | ICD-10-CM | POA: Diagnosis not present

## 2016-10-31 DIAGNOSIS — I10 Essential (primary) hypertension: Secondary | ICD-10-CM | POA: Diagnosis not present

## 2016-10-31 DIAGNOSIS — I120 Hypertensive chronic kidney disease with stage 5 chronic kidney disease or end stage renal disease: Secondary | ICD-10-CM | POA: Insufficient documentation

## 2016-10-31 DIAGNOSIS — E119 Type 2 diabetes mellitus without complications: Secondary | ICD-10-CM | POA: Diagnosis not present

## 2016-10-31 DIAGNOSIS — N186 End stage renal disease: Secondary | ICD-10-CM | POA: Diagnosis not present

## 2016-10-31 HISTORY — PX: PERIPHERAL VASCULAR CATHETERIZATION: SHX172C

## 2016-10-31 SURGERY — DIALYSIS/PERMA CATHETER REMOVAL
Anesthesia: Moderate Sedation

## 2016-10-31 NOTE — Discharge Instructions (Signed)
T    Resume your regular medications as prescribed by your doctor.  Avoid aspirin for 24 hours.    Change the Band-Aid or dressing as needed.  After a 2 days no dressing as needed.  Avoid strenuous activity for the remainder of the day.  Please notify your primary physician immediately if you have any unusual bleeding, trouble breathing, fever >100 degrees or pain not relieved by the medication your doctor prescribed for your doctor prescribed for you physician  Return to diaslysis  tommorow.

## 2016-10-31 NOTE — H&P (Signed)
[facility] Admission History & Physical  MRN : [id number]  Maria Chen is a 40 y.o. (1976-05-27) female who presents with chief complaint of No chief complaint on file. Marland Kitchen  History of Present Illness: I am asked to evaluate the patient by the dialysis center. The patient was sent here because they have been successfully using her arm access and the catheter is now clotted and nonfunctional. The patient is unaware of any other change.  Patient denies pain or tenderness overlying the access.  There is no pain with dialysis.  The patient denies hand pain or finger pain consistent with steal syndrome.     No current facility-administered medications for this encounter.    Facility-Administered Medications Ordered in Other Encounters  Medication Dose Route Frequency Provider Last Rate Last Dose  . cefUROXime (ZINACEF) 1.5 g in dextrose 5 % 50 mL IVPB  1.5 g Intravenous 30 min Pre-Op Sela Hua, PA-C        Past Medical History:  Diagnosis Date  . Chronic kidney disease    kidney scaring  . Hypertension     Past Surgical History:  Procedure Laterality Date  . BASCILIC VEIN TRANSPOSITION Left 06/30/2016   Procedure: BASCILIC VEIN TRANSPOSITION;  Surgeon: Katha Cabal, MD;  Location: [facility];  Service: Vascular;  Laterality: Left;  . COLONOSCOPY    . PERIPHERAL VASCULAR CATHETERIZATION N/A 07/25/2016   Procedure: Dialysis/Perma Catheter Insertion;  Surgeon: Katha Cabal, MD;  Location: [place] [facility];  Service: Cardiovascular;  Laterality: N/A;  . PERIPHERAL VASCULAR CATHETERIZATION Left 08/30/2016   Procedure: A/V Shuntogram/Fistulagram;  Surgeon: Katha Cabal, MD;  Location: [place] [facility];  Service: Cardiovascular;  Laterality: Left;  . perm cath insertion    . TONSILLECTOMY      Social History Social History  Substance Use Topics  . Smoking status: Former Smoker    Quit date: 06/19/2013  . Smokeless  tobacco: Never Used  . Alcohol use No    Family History Family History  Problem Relation Age of Onset  . Adopted: Yes    No family history of bleeding/clotting disorders, porphyria or autoimmune disease   Allergies  Allergen Reactions  . Altace [Ramipril] Anaphylaxis and Other (See Comments)    angioedema     REVIEW OF SYSTEMS (Negative unless checked)  Constitutional: [] Weight loss  [] Fever  [] Chills Cardiac: [] Chest pain   [] Chest pressure   [] Palpitations   [] Shortness of breath when laying flat   [] Shortness of breath at rest   [x] Shortness of breath with exertion. Vascular:  [] Pain in legs with walking   [] Pain in legs at rest   [] Pain in legs when laying flat   [] Claudication   [] Pain in feet when walking  [] Pain in feet at rest  [] Pain in feet when laying flat   [] History of DVT   [] Phlebitis   [] Swelling in legs   [] Varicose veins   [] Non-healing ulcers Pulmonary:   [] Uses home oxygen   [] Productive cough   [] Hemoptysis   [] Wheeze  [] COPD   [] Asthma Neurologic:  [] Dizziness  [] Blackouts   [] Seizures   [] History of stroke   [] History of TIA  [] Aphasia   [] Temporary blindness   [] Dysphagia   [] Weakness or numbness in arms   [] Weakness or numbness in legs Musculoskeletal:  [] Arthritis   [] Joint swelling   [] Joint pain   [] Low back pain Hematologic:  [] Easy bruising  [] Easy bleeding   [] Hypercoagulable state   [] Anemic  []   Hepatitis Gastrointestinal:  [] Blood in stool   [] Vomiting blood  [] Gastroesophageal reflux/heartburn   [] Difficulty swallowing. Genitourinary:  [x] Chronic kidney disease   [] Difficult urination  [] Frequent urination  [] Burning with urination   [] Blood in urine Skin:  [] Rashes   [] Ulcers   [] Wounds Psychological:  [] History of anxiety   []  History of major depression.  Physical Examination  Vitals:   10/31/16 0928  BP: 139/87  Pulse: 75  Resp: 20  Temp: 98.2 F (36.8 C)  TempSrc: Oral  SpO2: 96%  Weight: 88.9 kg (196 lb)  Height: 5\' 5"  (1.651 m)    Body mass index is 32.62 kg/m. Gen: WD/WN, NAD Head: Win[place]red/AT, No temporalis wasting. Prominent temp pulse not noted. Ear/Nose/Throat: Hearing grossly intact, nares w/o erythema or drainage, oropharynx w/o Erythema/Exudate,  Eyes: Conjunctiva clear, sclera non-icteric Neck: Trachea midline.  No JVD.  Pulmonary:  Good air movement, respirations not labored, no use of accessory muscles.  Cardiac: RRR, normal S1, S2. Vascular:  AV access good thrill good bruit, tunneled catheter intact no drainage clots noted in the lumens Vessel Right Left  Radial Palpable Palpable  Ulnar Palpable Palpable  Brachial Palpable Palpable  Carotid Palpable, without bruit Palpable, without bruit  Gastrointestinal: soft, non-tender/non-distended. No guarding/reflex.  Musculoskeletal: M/S 5/5 throughout.  Extremities without ischemic changes.  No deformity or atrophy.  Neurologic: Sensation grossly intact in extremities.  Symmetrical.  Speech is fluent. Motor exam as listed above. Psychiatric: Judgment intact, Mood & affect appropriate for pt's clinical situation. Dermatologic: No rashes or ulcers noted.  No cellulitis or open wounds. Lymph : No Cervical, Axillary, or Inguinal lymphadenopathy.   CBC Lab Results  Component Value Date   WBC 6.6 06/19/2016   HGB 11.0 (L) 06/19/2016   HCT 31.5 (L) 06/19/2016   MCV 88.6 06/19/2016   PLT 218 06/19/2016    BMET    Component Value Date/Time   NA 136 06/19/2016 1417   NA 138 02/14/2015 1716   K 4.5 06/19/2016 1417   K 3.7 02/14/2015 1716   CL 108 06/19/2016 1417   CL 102 02/14/2015 1716   CO2 21 (L) 06/19/2016 1417   CO2 24 02/14/2015 1716   GLUCOSE 91 06/19/2016 1417   GLUCOSE 118 (H) 02/14/2015 1716   BUN 67 (H) 06/19/2016 1417   BUN 26 (H) 02/14/2015 1716   CREATININE 7.74 (H) 06/19/2016 1417   CREATININE 2.60 (H) 02/15/2015 1449   CALCIUM 9.7 06/19/2016 1417   CALCIUM 9.9 02/14/2015 1716   GFRNONAA 6 (L) 06/19/2016 1417   GFRNONAA 23 (L)  02/15/2015 1449   GFRAA 7 (L) 06/19/2016 1417   GFRAA 26 (L) 02/15/2015 1449   CrCl cannot be calculated (Patient's most recent lab result is older than the maximum 21 days allowed.).  COAG Lab Results  Component Value Date   INR 1.00 06/19/2016    Radiology No results found.  Assessment/Plan 1.  Complication dialysis device with thrombosis of the Tunneled Catheter:  Patient's tunneled cahteter is thrombosed and no longer needed as there is adequate extremity access. The patient will undergo removal of the tunneled cahteter. 2.  End-stage renal disease requiring hemodialysis:  Patient will continue dialysis therapy without interruption. Dialysis has already been arranged and the patient will continue per the usual schedule 3.  Hypertension:  Patient will continue medical management; nephrology is following no changes in oral medications. 4. Diabetes mellitus:  Glucose will be monitored and oral medications been held this morning once the patient has undergone the patient's  procedure po intake will be reinitiated and again Accu-Cheks will be used to assess the blood glucose level and treat as needed. The patient will be restarted on the patient's usual hypoglycemic regime    Hortencia Pilar, MD  10/31/2016 10:22 AM

## 2016-10-31 NOTE — Op Note (Signed)
  OPERATIVE NOTE   PROCEDURE: 1. Removal of a right IJ tunneled dialysis catheter  PRE-OPERATIVE DIAGNOSIS: Complication of dialysis catheter, End stage renal disease  POST-OPERATIVE DIAGNOSIS: Same  SURGEON: Hortencia Pilar, M.D.  ANESTHESIA: Local anesthetic with 1% lidocaine with epinephrine   ESTIMATED BLOOD LOSS: Minimal   FINDING(S): 1. Catheter intact   SPECIMEN(S):  Catheter  INDICATIONS:   Maria Chen is a 40 y.o. female who presents with a functioning basilic transposition and a nonfunctioning right IJ catheter.  The patient has undergone placement of an extremity access which is working and this has been successfully cannulated without difficulty.  therefore is undergoing removal of his tunneled catheter which is no longer needed to avoid septic complications.   DESCRIPTION: After obtaining full informed written consent, the patient was positioned supine. The right IJ catheter and surrounding area is prepped and draped in a sterile fashion. The cuff was localized by palpation and noted to be less than 3 cm from the exit site. After appropriate timeout is called, 1% lidocaine with epinephrine is infiltrated into the surrounding tissues around the cuff. Small transverse incision is created at the exit site with an 11 blade scalpel and the dissection was carried up along the catheter to expose the cuff of the tunneled catheter.  The catheter cuff is then freed from the surrounding attachments and adhesions. Once the catheter has been freed circumferentially it is removed in 1 piece. Light pressure was held at the base of the neck.   Antibiotic ointment and a sterile dressing is applied to the exit site. Patient tolerated procedure well and there were no complications.  COMPLICATIONS: None  CONDITION: Unchanged  Hortencia Pilar, M.D. [facility] Office: [phone]  10/31/2016,11:05 AM

## 2016-11-01 MED ORDER — LIDOCAINE-EPINEPHRINE (PF) 1 %-1:200000 IJ SOLN
INTRAMUSCULAR | Status: DC | PRN
  Administered 2016-10-31: 10 mL

## 2016-11-03 ENCOUNTER — Encounter: Payer: Self-pay | Admitting: Vascular Surgery

## 2016-11-17 IMAGING — US US PELVIS COMPLETE
1 series · 14 of 25 positions shown · non-contrast
Comparison: None

CLINICAL DATA: Patient with right lower quadrant abdominal pain for
2 years.

EXAM:
TRANSABDOMINAL AND TRANSVAGINAL ULTRASOUND OF PELVIS
TECHNIQUE: Both transabdominal and transvaginal ultrasound examinations of the
pelvis were performed. Transabdominal technique was performed for
global imaging of the pelvis including uterus, ovaries, adnexal
regions, and pelvic cul-de-sac. It was necessary to proceed with
endovaginal exam following the transabdominal exam to visualize the
adnexal structures..

[Series 1: us pelvis complete · 0.24mm/px · 14 of 60 slices shown]
[im 1/60]
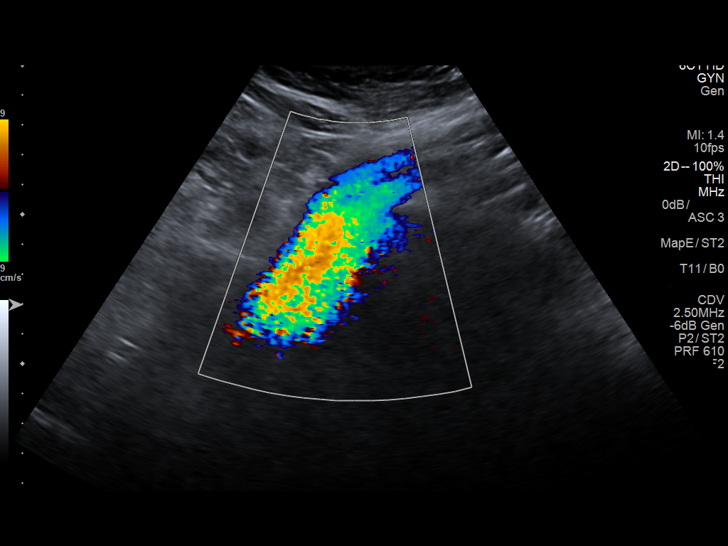
[im 5/60]
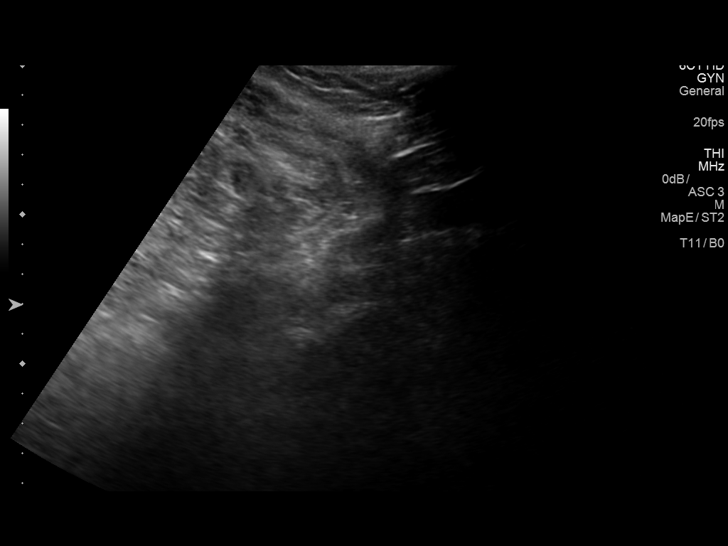
[im 10/60]
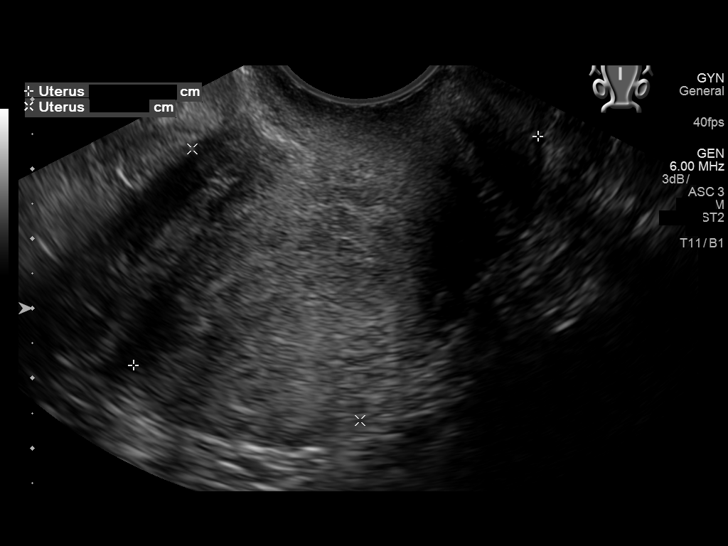
[im 15/60]
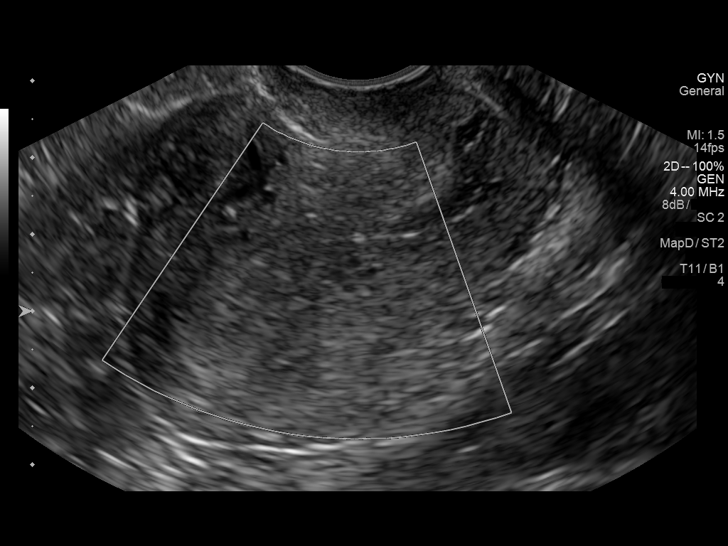
[im 20/60]
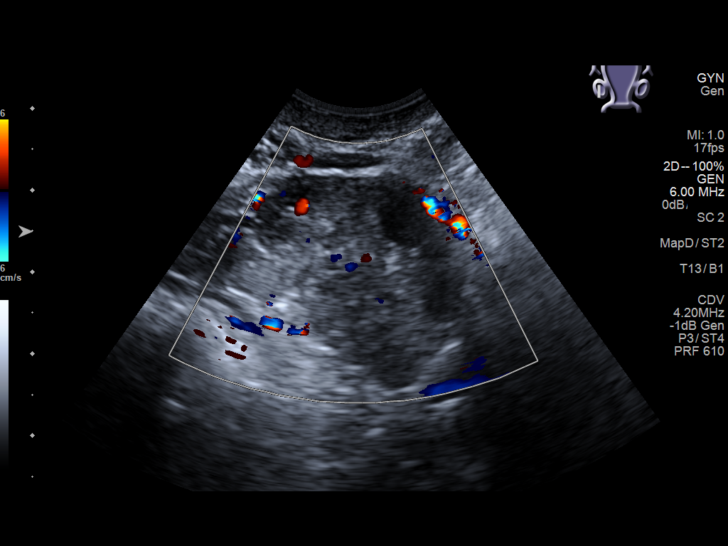
[im 23/60]
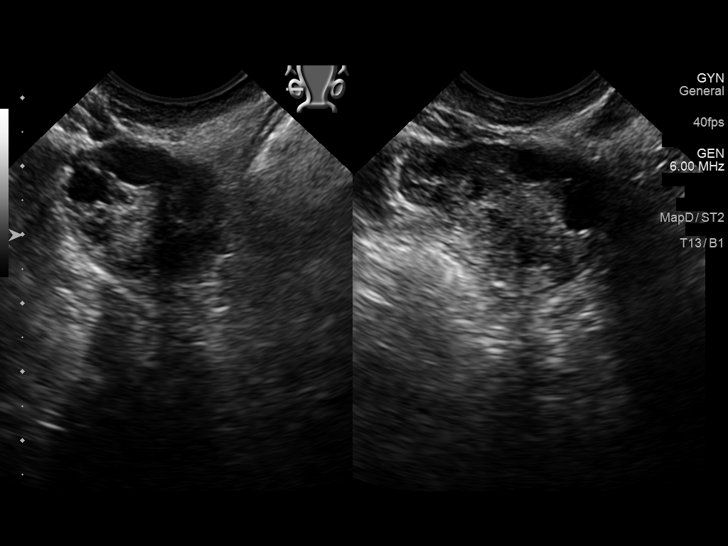
[im 28/60]
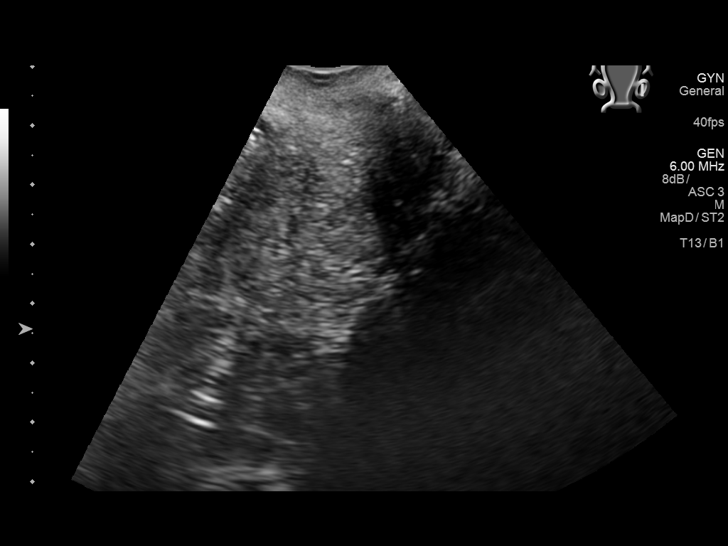
[im 32/60]
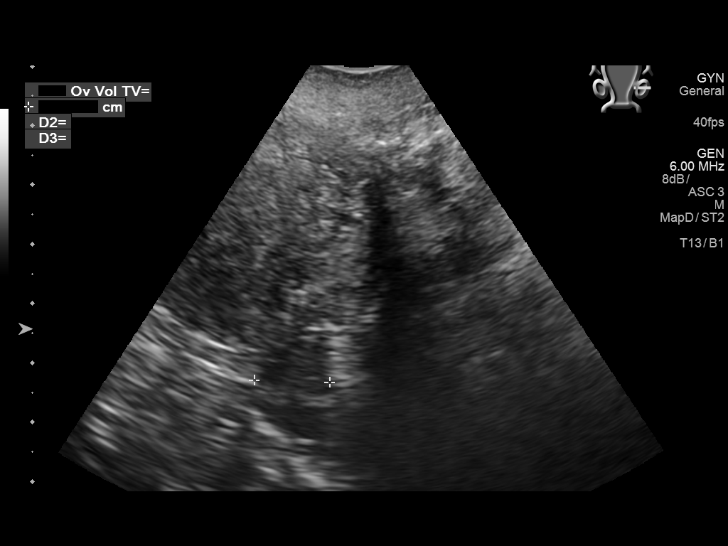
[im 37/60]
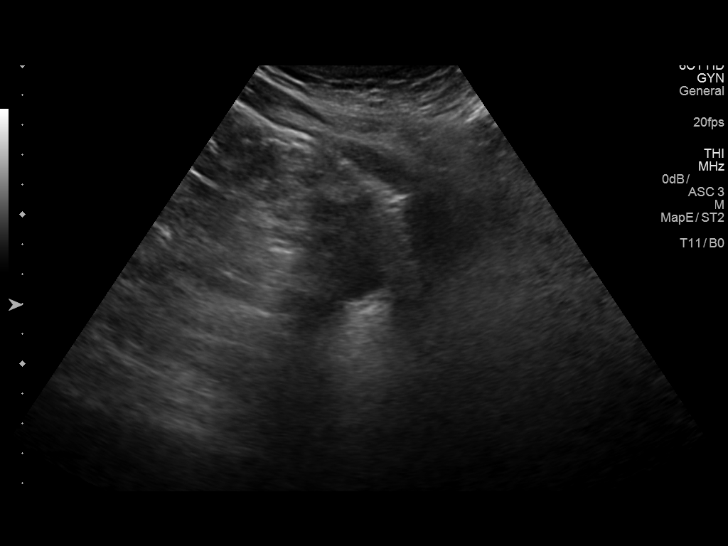
[im 40/60]
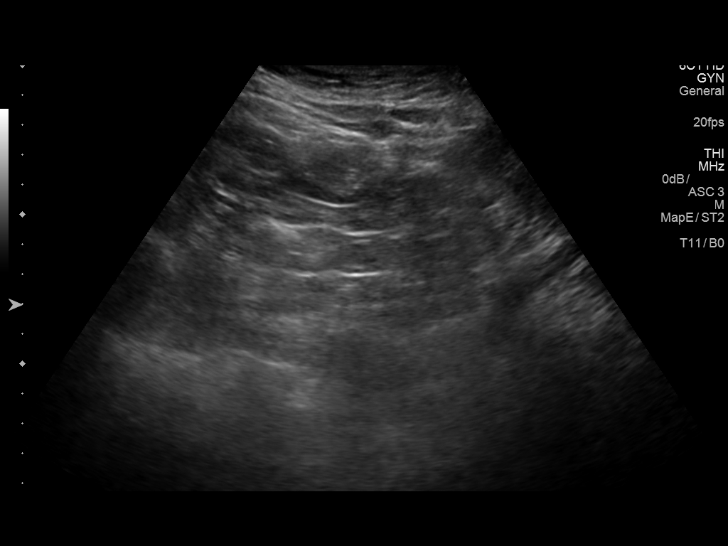
[im 45/60]
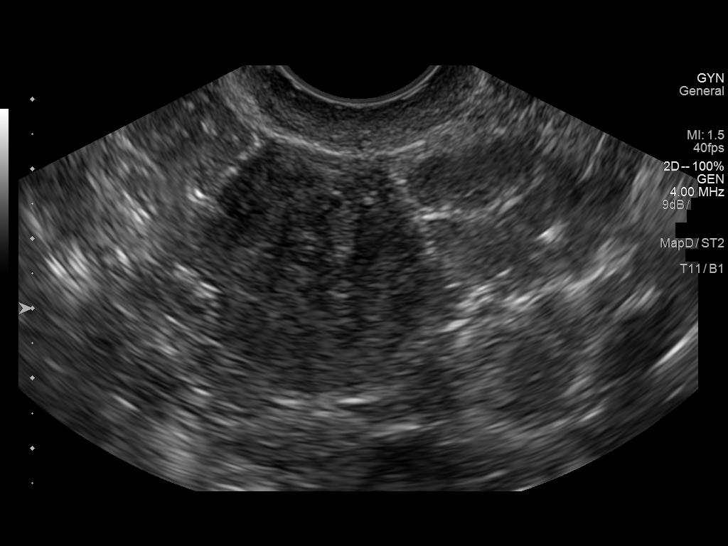
[im 50/60]
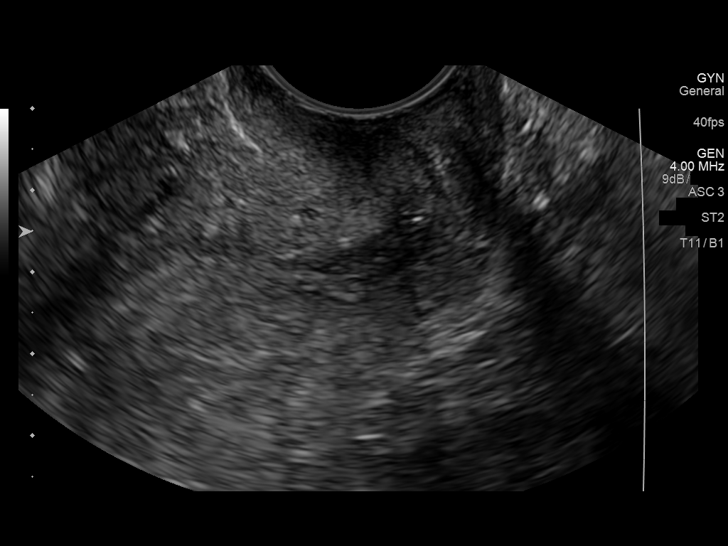
[im 55/60]
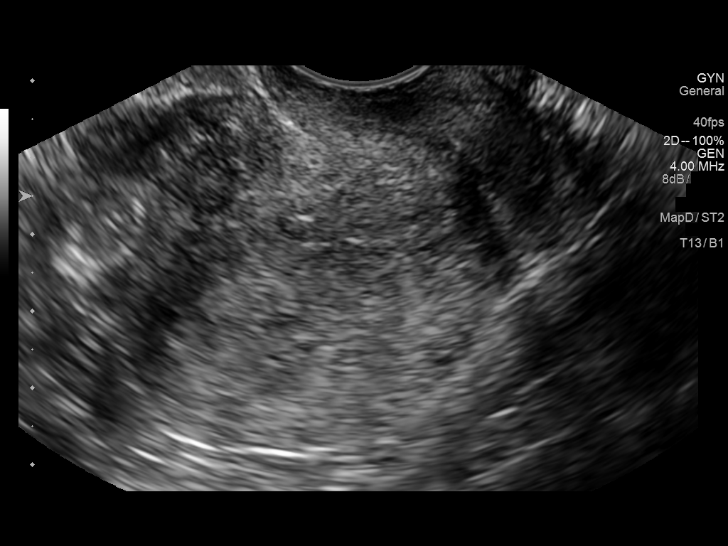
[im 60/60]
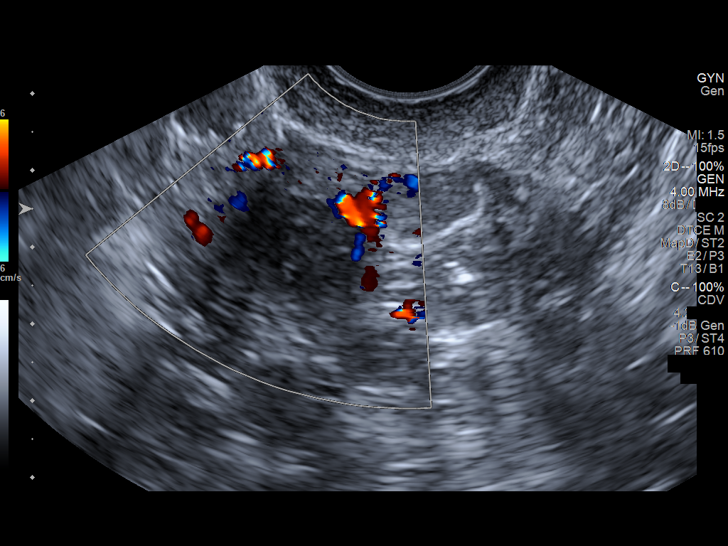

[14 of 25 positions shown; findings below may reference images not displayed]

FINDINGS: Uterus

Measurements: 6.7 x 4.6 x 4.8 cm.. There is a 1.8 x 1.5 x 2.1 cm sub
serosal fibroid anterior uterine body.

Endometrium

Thickness: 4 mm.  No focal abnormality visualized.

Right ovary

Measurements: 3.1 x 1.8 x 2.3 cm. Normal appearance/no adnexal mass.

Left ovary

Measurements: 2.6 x 1.6 x 1.3 cm. Normal appearance/no adnexal mass.

Other findings

No abnormal free fluid.
IMPRESSION: No acute process within the pelvis.

Fibroid uterus.

## 2016-12-25 ENCOUNTER — Ambulatory Visit (INDEPENDENT_AMBULATORY_CARE_PROVIDER_SITE_OTHER): Payer: Medicaid Other | Admitting: Vascular Surgery

## 2016-12-25 ENCOUNTER — Encounter (INDEPENDENT_AMBULATORY_CARE_PROVIDER_SITE_OTHER): Payer: Medicare Other

## 2017-10-24 IMAGING — CT CT HEAD W/O CM
1 of 2 series · 15 of 30 positions shown, 19 images · non-contrast
Comparison: None.

CLINICAL DATA: While mopping floor with clorox, started to feel
lightheaded, walked out of room to take a break and while walking
back to bedroom, passed out. No hx of stroke, seizure or cancer.

EXAM:
CT HEAD WITHOUT CONTRAST
TECHNIQUE: Contiguous axial images were obtained from the base of the skull
through the vertex without intravenous contrast.

[Series 2: head wo · axial · 0.47mm/px · z∈[-125,+1]mm · 15 of 32 slices shown, 19 images]
[im 2/32  brain]
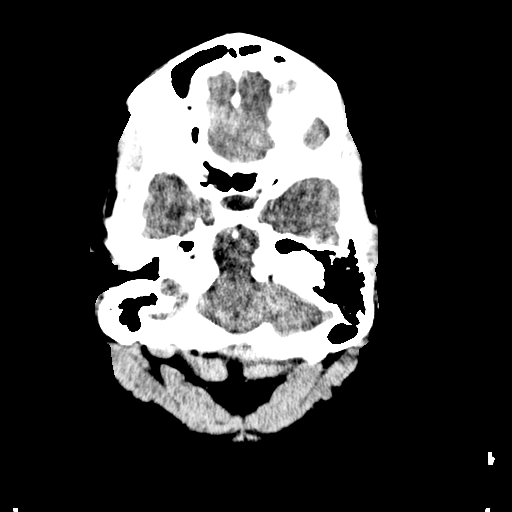
[im 2/32  bone]
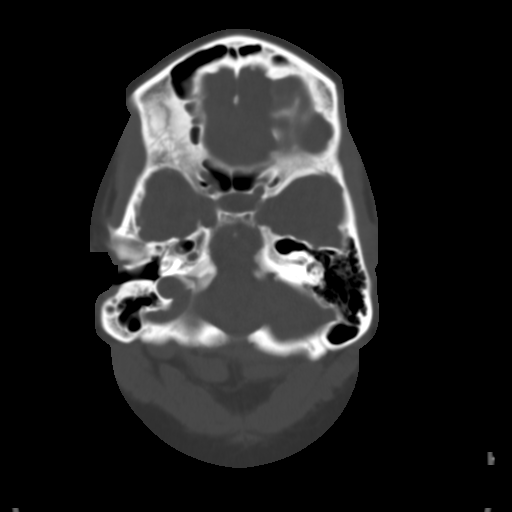
[im 5/32  brain]
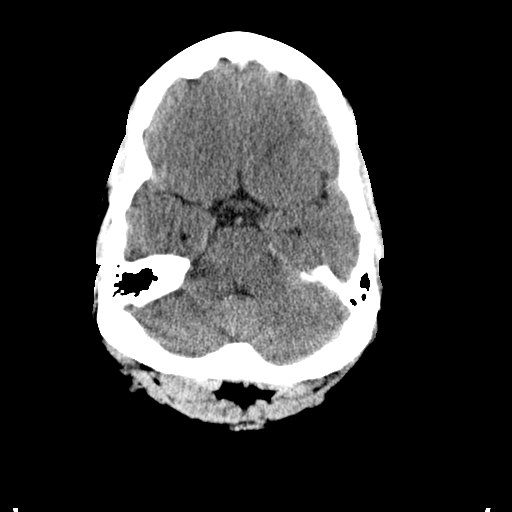
[im 6/32  brain]
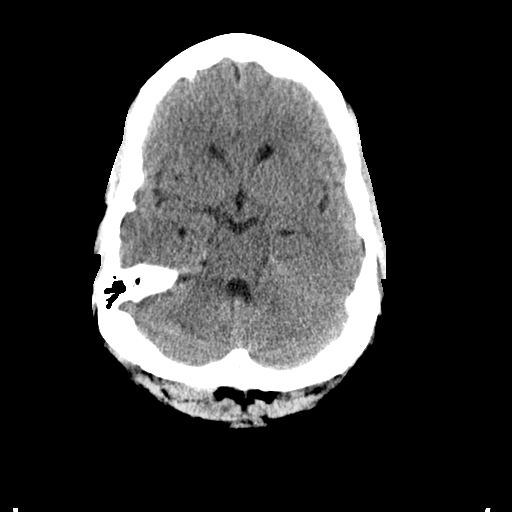
[im 9/32  brain]
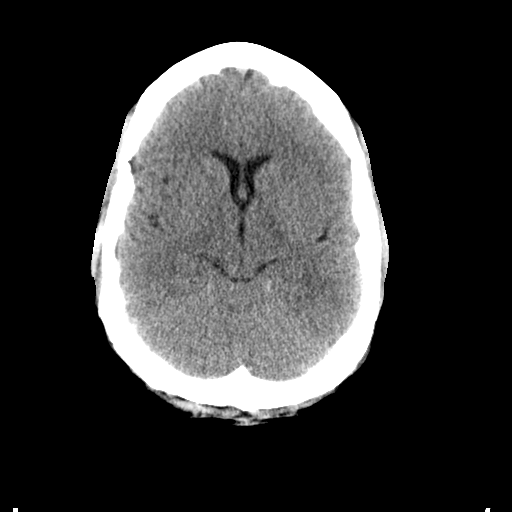
[im 10/32  brain]
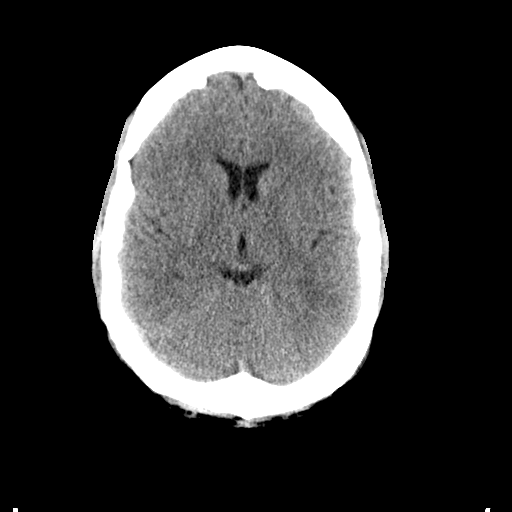
[im 10/32  bone]
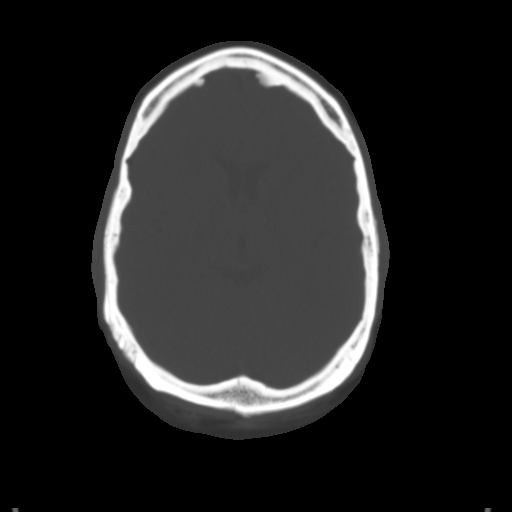
[im 13/32  brain]
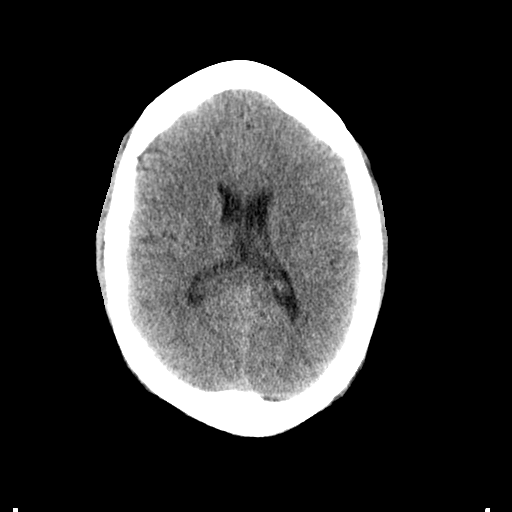
[im 14/32  brain]
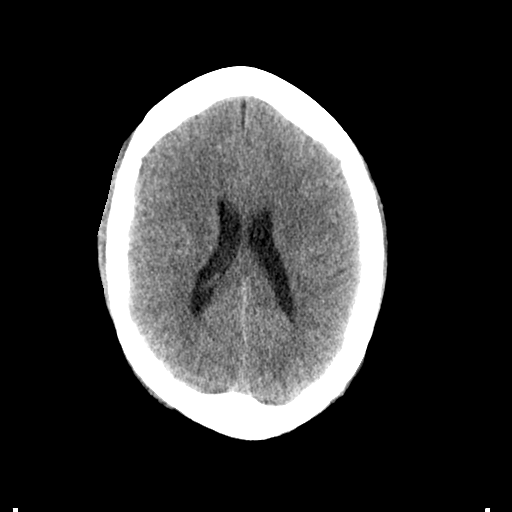
[im 17/32  brain]
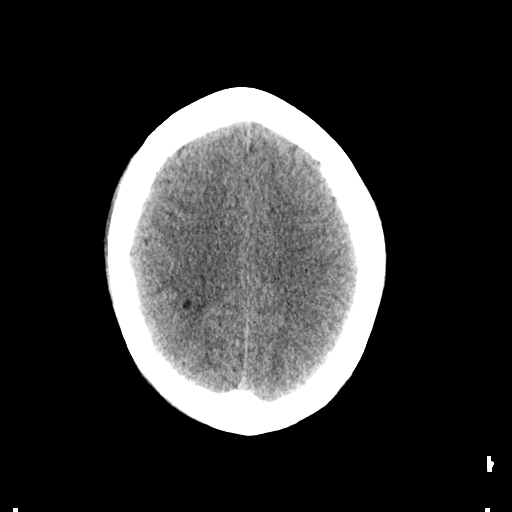
[im 18/32  brain]
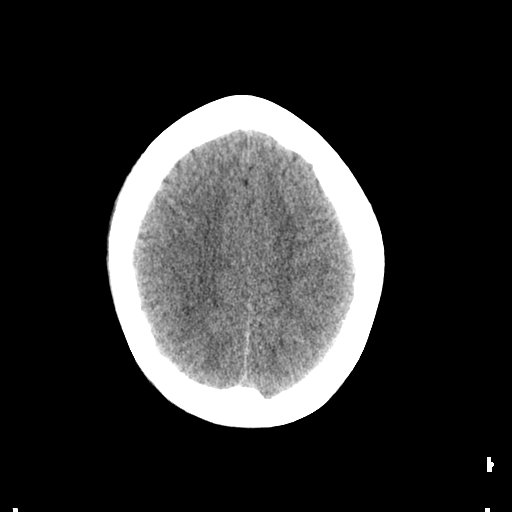
[im 18/32  bone]
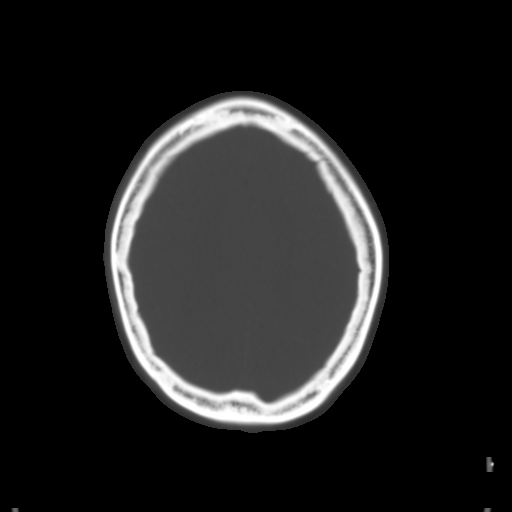
[im 19/32  brain]
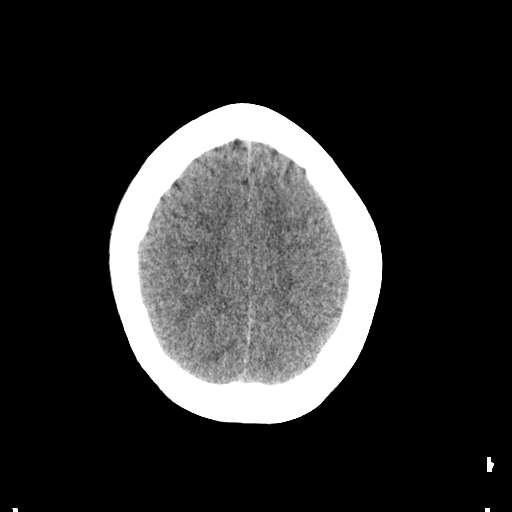
[im 22/32  brain]
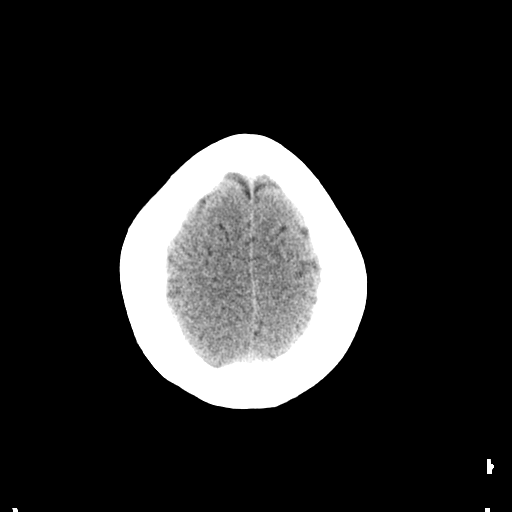
[im 23/32  brain]
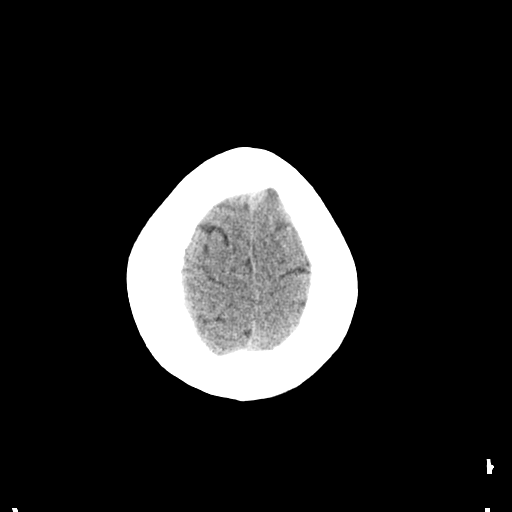
[im 26/32  brain]
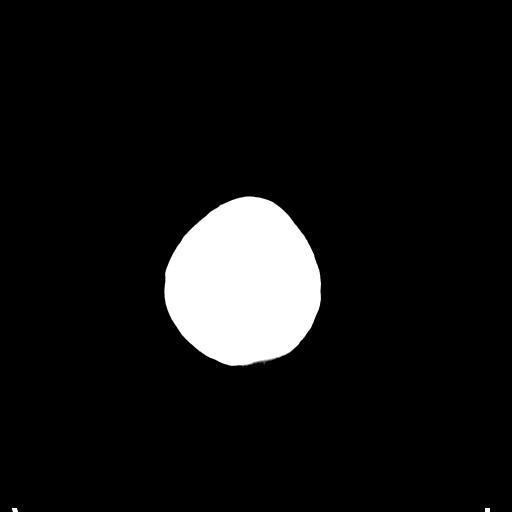
[im 26/32  bone]
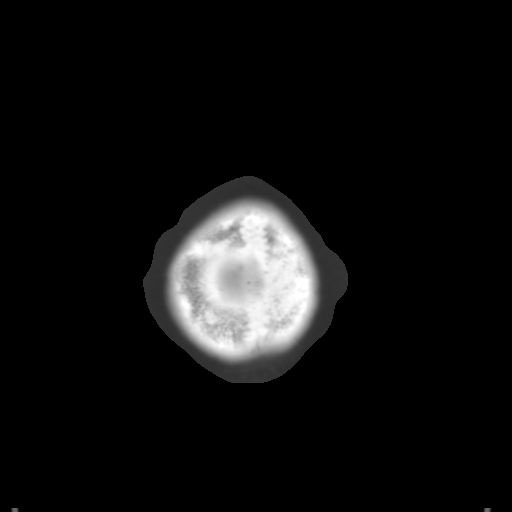
[im 27/32  brain]
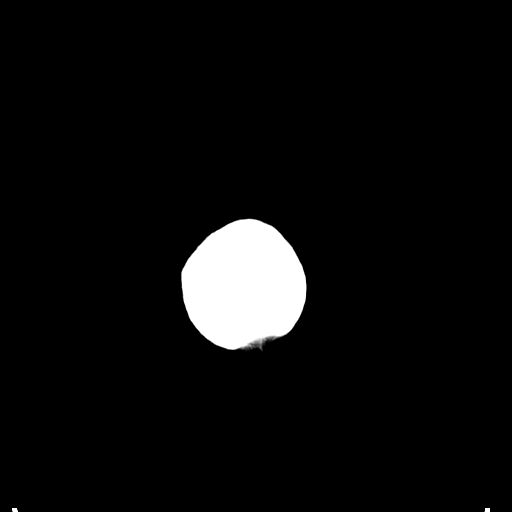
[im 30/32  brain]
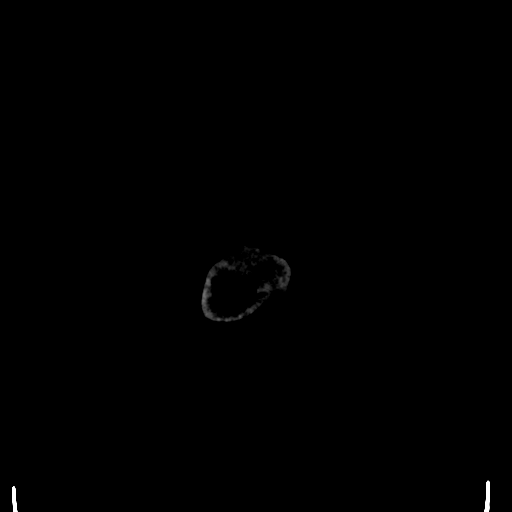

[15 of 30 positions shown; findings below may reference images not displayed]

FINDINGS: 4 mm lucent sharply demarcated focus in the right parietal lobe
image [DATE]. There is no evidence of acute intracranial hemorrhage,
brain edema, acute infarction, mass effect, or midline shift. Acute
infarct may be inapparent on noncontrast CT. No other intra-axial
abnormalities are seen, and the ventricles and sulci are within
normal limits in size and symmetry. No abnormal extra-axial fluid
collections or masses are identified. No significant calvarial
abnormality.
IMPRESSION: 1. Negative for bleed or other acute intracranial process.
2. Small lucent lesion in the right parietal lobe, possibly benign
neuroglial cyst but nonspecific. Recommend elective outpatient MR
brain with contrast for further characterization. Findings reviewed
with Dr. Fungki, who concurs.
